# Patient Record
Sex: Female | Born: 1975 | Race: White | Hispanic: No | Marital: Single | Smoking: Never smoker
Health system: Southern US, Community
[De-identification: ages and names within clinical notes are randomized; demographics above are authoritative.]

## PROBLEM LIST (undated history)

## (undated) DIAGNOSIS — E2740 Unspecified adrenocortical insufficiency: Secondary | ICD-10-CM

## (undated) DIAGNOSIS — C719 Malignant neoplasm of brain, unspecified: Secondary | ICD-10-CM

## (undated) HISTORY — PX: BRAIN SURGERY: SHX531

---

## 2014-09-02 ENCOUNTER — Inpatient Hospital Stay (HOSPITAL_COMMUNITY)
Admission: EM | Admit: 2014-09-02 | Discharge: 2014-09-10 | DRG: 871 | Disposition: A | Discharge status: Home Health | Payer: Medicare Other | Attending: Internal Medicine | Admitting: Internal Medicine

## 2014-09-02 ENCOUNTER — Encounter (HOSPITAL_COMMUNITY): Payer: Self-pay | Admitting: *Deleted

## 2014-09-02 ENCOUNTER — Emergency Department (HOSPITAL_COMMUNITY): Payer: Medicare Other

## 2014-09-02 DIAGNOSIS — Z7952 Long term (current) use of systemic steroids: Secondary | ICD-10-CM | POA: Diagnosis not present

## 2014-09-02 DIAGNOSIS — E0590 Thyrotoxicosis, unspecified without thyrotoxic crisis or storm: Secondary | ICD-10-CM | POA: Diagnosis present

## 2014-09-02 DIAGNOSIS — A419 Sepsis, unspecified organism: Secondary | ICD-10-CM | POA: Diagnosis not present

## 2014-09-02 DIAGNOSIS — T380X5A Adverse effect of glucocorticoids and synthetic analogues, initial encounter: Secondary | ICD-10-CM | POA: Diagnosis present

## 2014-09-02 DIAGNOSIS — A084 Viral intestinal infection, unspecified: Secondary | ICD-10-CM | POA: Diagnosis not present

## 2014-09-02 DIAGNOSIS — R6520 Severe sepsis without septic shock: Secondary | ICD-10-CM | POA: Diagnosis present

## 2014-09-02 DIAGNOSIS — G9340 Encephalopathy, unspecified: Secondary | ICD-10-CM | POA: Diagnosis not present

## 2014-09-02 DIAGNOSIS — R6521 Severe sepsis with septic shock: Secondary | ICD-10-CM | POA: Diagnosis present

## 2014-09-02 DIAGNOSIS — E872 Acidosis, unspecified: Secondary | ICD-10-CM | POA: Diagnosis present

## 2014-09-02 DIAGNOSIS — E2740 Unspecified adrenocortical insufficiency: Secondary | ICD-10-CM | POA: Diagnosis present

## 2014-09-02 DIAGNOSIS — E870 Hyperosmolality and hypernatremia: Secondary | ICD-10-CM | POA: Diagnosis present

## 2014-09-02 DIAGNOSIS — E874 Mixed disorder of acid-base balance: Secondary | ICD-10-CM | POA: Diagnosis present

## 2014-09-02 DIAGNOSIS — Z881 Allergy status to other antibiotic agents status: Secondary | ICD-10-CM | POA: Diagnosis not present

## 2014-09-02 DIAGNOSIS — D649 Anemia, unspecified: Secondary | ICD-10-CM | POA: Diagnosis present

## 2014-09-02 DIAGNOSIS — Z79899 Other long term (current) drug therapy: Secondary | ICD-10-CM | POA: Diagnosis not present

## 2014-09-02 DIAGNOSIS — E878 Other disorders of electrolyte and fluid balance, not elsewhere classified: Secondary | ICD-10-CM | POA: Diagnosis present

## 2014-09-02 DIAGNOSIS — H9190 Unspecified hearing loss, unspecified ear: Secondary | ICD-10-CM | POA: Diagnosis present

## 2014-09-02 DIAGNOSIS — Z91048 Other nonmedicinal substance allergy status: Secondary | ICD-10-CM | POA: Diagnosis not present

## 2014-09-02 DIAGNOSIS — I5033 Acute on chronic diastolic (congestive) heart failure: Secondary | ICD-10-CM | POA: Diagnosis present

## 2014-09-02 DIAGNOSIS — N179 Acute kidney failure, unspecified: Secondary | ICD-10-CM | POA: Diagnosis not present

## 2014-09-02 DIAGNOSIS — Z85841 Personal history of malignant neoplasm of brain: Secondary | ICD-10-CM

## 2014-09-02 DIAGNOSIS — F419 Anxiety disorder, unspecified: Secondary | ICD-10-CM | POA: Diagnosis present

## 2014-09-02 DIAGNOSIS — I5032 Chronic diastolic (congestive) heart failure: Secondary | ICD-10-CM | POA: Diagnosis present

## 2014-09-02 DIAGNOSIS — R4182 Altered mental status, unspecified: Secondary | ICD-10-CM | POA: Diagnosis present

## 2014-09-02 DIAGNOSIS — Z923 Personal history of irradiation: Secondary | ICD-10-CM

## 2014-09-02 DIAGNOSIS — I517 Cardiomegaly: Secondary | ICD-10-CM | POA: Diagnosis present

## 2014-09-02 DIAGNOSIS — G9341 Metabolic encephalopathy: Secondary | ICD-10-CM | POA: Diagnosis present

## 2014-09-02 DIAGNOSIS — J9600 Acute respiratory failure, unspecified whether with hypoxia or hypercapnia: Secondary | ICD-10-CM | POA: Diagnosis not present

## 2014-09-02 DIAGNOSIS — J9601 Acute respiratory failure with hypoxia: Secondary | ICD-10-CM | POA: Diagnosis not present

## 2014-09-02 DIAGNOSIS — E230 Hypopituitarism: Secondary | ICD-10-CM | POA: Diagnosis present

## 2014-09-02 DIAGNOSIS — D899 Disorder involving the immune mechanism, unspecified: Secondary | ICD-10-CM | POA: Diagnosis present

## 2014-09-02 DIAGNOSIS — Z4659 Encounter for fitting and adjustment of other gastrointestinal appliance and device: Secondary | ICD-10-CM

## 2014-09-02 DIAGNOSIS — E893 Postprocedural hypopituitarism: Secondary | ICD-10-CM | POA: Diagnosis present

## 2014-09-02 DIAGNOSIS — E876 Hypokalemia: Secondary | ICD-10-CM | POA: Diagnosis not present

## 2014-09-02 DIAGNOSIS — R0600 Dyspnea, unspecified: Secondary | ICD-10-CM | POA: Diagnosis not present

## 2014-09-02 DIAGNOSIS — IMO0001 Reserved for inherently not codable concepts without codable children: Secondary | ICD-10-CM | POA: Diagnosis present

## 2014-09-02 DIAGNOSIS — R059 Cough, unspecified: Secondary | ICD-10-CM

## 2014-09-02 DIAGNOSIS — J9690 Respiratory failure, unspecified, unspecified whether with hypoxia or hypercapnia: Secondary | ICD-10-CM | POA: Diagnosis present

## 2014-09-02 DIAGNOSIS — R05 Cough: Secondary | ICD-10-CM

## 2014-09-02 DIAGNOSIS — K529 Noninfective gastroenteritis and colitis, unspecified: Secondary | ICD-10-CM | POA: Diagnosis not present

## 2014-09-02 DIAGNOSIS — Z7982 Long term (current) use of aspirin: Secondary | ICD-10-CM

## 2014-09-02 DIAGNOSIS — E232 Diabetes insipidus: Secondary | ICD-10-CM | POA: Diagnosis not present

## 2014-09-02 DIAGNOSIS — R0602 Shortness of breath: Secondary | ICD-10-CM

## 2014-09-02 DIAGNOSIS — R410 Disorientation, unspecified: Secondary | ICD-10-CM | POA: Diagnosis not present

## 2014-09-02 DIAGNOSIS — R40243 Glasgow coma scale score 3-8: Secondary | ICD-10-CM | POA: Diagnosis not present

## 2014-09-02 DIAGNOSIS — Z978 Presence of other specified devices: Secondary | ICD-10-CM

## 2014-09-02 DIAGNOSIS — R739 Hyperglycemia, unspecified: Secondary | ICD-10-CM | POA: Diagnosis present

## 2014-09-02 HISTORY — DX: Unspecified adrenocortical insufficiency: E27.40

## 2014-09-02 HISTORY — DX: Malignant neoplasm of brain, unspecified: C71.9

## 2014-09-02 LAB — COMPREHENSIVE METABOLIC PANEL
ALT: 17 U/L (ref 14–54)
AST: 28 U/L (ref 15–41)
Albumin: 3.1 g/dL — ABNORMAL LOW (ref 3.5–5.0)
Alkaline Phosphatase: 71 U/L (ref 38–126)
Anion gap: 15 (ref 5–15)
BUN: 16 mg/dL (ref 6–20)
CO2: 15 mmol/L — AB (ref 22–32)
Calcium: 8.1 mg/dL — ABNORMAL LOW (ref 8.9–10.3)
Chloride: 111 mmol/L (ref 101–111)
Creatinine, Ser: 1.54 mg/dL — ABNORMAL HIGH (ref 0.44–1.00)
GFR, EST AFRICAN AMERICAN: 48 mL/min — AB (ref >60)
GFR, EST NON AFRICAN AMERICAN: 41 mL/min — AB (ref >60)
GLUCOSE: 99 mg/dL (ref 65–99)
Potassium: 3.9 mmol/L (ref 3.5–5.1)
Sodium: 141 mmol/L (ref 135–145)
Total Bilirubin: 0.6 mg/dL (ref 0.3–1.2)
Total Protein: 6.7 g/dL (ref 6.5–8.1)

## 2014-09-02 LAB — CBC WITH DIFFERENTIAL/PLATELET
BASOS PCT: 0 % (ref 0–1)
Basophils Absolute: 0 10*3/uL (ref 0.0–0.1)
EOS PCT: 0 % (ref 0–5)
Eosinophils Absolute: 0 10*3/uL (ref 0.0–0.7)
HEMATOCRIT: 45 % (ref 36.0–46.0)
Hemoglobin: 14.4 g/dL (ref 12.0–15.0)
Lymphocytes Relative: 10 % — ABNORMAL LOW (ref 12–46)
Lymphs Abs: 1.5 10*3/uL (ref 0.7–4.0)
MCH: 22.7 pg — AB (ref 26.0–34.0)
MCHC: 32 g/dL (ref 30.0–36.0)
MCV: 71.1 fL — ABNORMAL LOW (ref 78.0–100.0)
MONOS PCT: 3 % (ref 3–12)
Monocytes Absolute: 0.5 10*3/uL (ref 0.1–1.0)
NEUTROS ABS: 13.2 10*3/uL — AB (ref 1.7–7.7)
Neutrophils Relative %: 87 % — ABNORMAL HIGH (ref 43–77)
Platelets: 721 10*3/uL — ABNORMAL HIGH (ref 150–400)
RBC: 6.33 MIL/uL — AB (ref 3.87–5.11)
RDW: 17.3 % — AB (ref 11.5–15.5)
WBC: 15.2 10*3/uL — AB (ref 4.0–10.5)

## 2014-09-02 LAB — URINALYSIS, ROUTINE W REFLEX MICROSCOPIC
Bilirubin Urine: NEGATIVE
Bilirubin Urine: NEGATIVE
GLUCOSE, UA: NEGATIVE mg/dL
Glucose, UA: NEGATIVE mg/dL
Ketones, ur: NEGATIVE mg/dL
Ketones, ur: NEGATIVE mg/dL
LEUKOCYTES UA: NEGATIVE
Leukocytes, UA: NEGATIVE
NITRITE: NEGATIVE
Nitrite: NEGATIVE
Protein, ur: NEGATIVE mg/dL
Protein, ur: NEGATIVE mg/dL
SPECIFIC GRAVITY, URINE: 1.011 (ref 1.005–1.030)
SPECIFIC GRAVITY, URINE: 1.003 — AB (ref 1.005–1.030)
UROBILINOGEN UA: 0.2 mg/dL (ref 0.0–1.0)
Urobilinogen, UA: 0.2 mg/dL (ref 0.0–1.0)
pH: 5 (ref 5.0–8.0)
pH: 5 (ref 5.0–8.0)

## 2014-09-02 LAB — URINE MICROSCOPIC-ADD ON

## 2014-09-02 LAB — AMYLASE: Amylase: 17 U/L — ABNORMAL LOW (ref 28–100)

## 2014-09-02 LAB — MRSA PCR SCREENING: MRSA by PCR: NEGATIVE

## 2014-09-02 LAB — PROCALCITONIN: Procalcitonin: 1.57 ng/mL

## 2014-09-02 LAB — I-STAT CG4 LACTIC ACID, ED
LACTIC ACID, VENOUS: 3.61 mmol/L — AB (ref 0.5–2.0)
Lactic Acid, Venous: 4.09 mmol/L (ref 0.5–2.0)

## 2014-09-02 LAB — LIPASE, BLOOD: Lipase: 12 U/L — ABNORMAL LOW (ref 22–51)

## 2014-09-02 LAB — GLUCOSE, CAPILLARY: GLUCOSE-CAPILLARY: 141 mg/dL — AB (ref 65–99)

## 2014-09-02 LAB — CORTISOL: Cortisol, Plasma: 46.3 ug/dL

## 2014-09-02 MED ORDER — SODIUM CHLORIDE 0.9 % IV BOLUS (SEPSIS)
2000.0000 mL | Freq: Once | INTRAVENOUS | Status: AC
  Administered 2014-09-02: 2000 mL via INTRAVENOUS

## 2014-09-02 MED ORDER — ENOXAPARIN SODIUM 40 MG/0.4ML ~~LOC~~ SOLN
40.0000 mg | SUBCUTANEOUS | Status: DC
  Administered 2014-09-02 – 2014-09-09 (×8): 40 mg via SUBCUTANEOUS
  Filled 2014-09-02 (×14): qty 0.4

## 2014-09-02 MED ORDER — DEXTROSE 5 % IV SOLN
INTRAVENOUS | Status: DC
  Administered 2014-09-02: 21:00:00 via INTRAVENOUS
  Filled 2014-09-02 (×3): qty 100

## 2014-09-02 MED ORDER — CIPROFLOXACIN IN D5W 400 MG/200ML IV SOLN
400.0000 mg | Freq: Two times a day (BID) | INTRAVENOUS | Status: DC
  Filled 2014-09-02: qty 200

## 2014-09-02 MED ORDER — CETYLPYRIDINIUM CHLORIDE 0.05 % MT LIQD
7.0000 mL | Freq: Two times a day (BID) | OROMUCOSAL | Status: DC
  Administered 2014-09-02: 7 mL via OROMUCOSAL

## 2014-09-02 MED ORDER — FAMOTIDINE IN NACL 20-0.9 MG/50ML-% IV SOLN
20.0000 mg | INTRAVENOUS | Status: DC
  Administered 2014-09-02 – 2014-09-03 (×2): 20 mg via INTRAVENOUS
  Filled 2014-09-02 (×3): qty 50

## 2014-09-02 MED ORDER — LIDOCAINE HCL (PF) 1 % IJ SOLN
INTRAMUSCULAR | Status: AC
  Administered 2014-09-02: 2 mL via INTRADERMAL
  Filled 2014-09-02: qty 5

## 2014-09-02 MED ORDER — SODIUM CHLORIDE 0.9 % IJ SOLN
10.0000 mL | Freq: Two times a day (BID) | INTRAMUSCULAR | Status: DC
  Administered 2014-09-02 – 2014-09-05 (×5): 10 mL
  Administered 2014-09-06 (×2): 20 mL
  Administered 2014-09-07: 30 mL
  Administered 2014-09-07 – 2014-09-08 (×2): 10 mL
  Administered 2014-09-08: 30 mL

## 2014-09-02 MED ORDER — ASPIRIN 81 MG PO CHEW
81.0000 mg | CHEWABLE_TABLET | Freq: Every day | ORAL | Status: DC
  Administered 2014-09-03: 81 mg via ORAL
  Filled 2014-09-02: qty 1

## 2014-09-02 MED ORDER — VANCOMYCIN HCL IN DEXTROSE 1-5 GM/200ML-% IV SOLN
1000.0000 mg | Freq: Once | INTRAVENOUS | Status: AC
  Administered 2014-09-02: 1000 mg via INTRAVENOUS
  Filled 2014-09-02: qty 200

## 2014-09-02 MED ORDER — SODIUM CHLORIDE 0.9 % IJ SOLN
10.0000 mL | INTRAMUSCULAR | Status: DC | PRN
  Administered 2014-09-03 – 2014-09-09 (×2): 10 mL
  Filled 2014-09-02 (×2): qty 40

## 2014-09-02 MED ORDER — ACETAMINOPHEN 500 MG PO TABS
1000.0000 mg | ORAL_TABLET | Freq: Once | ORAL | Status: AC
  Administered 2014-09-02: 1000 mg via ORAL
  Filled 2014-09-02: qty 2

## 2014-09-02 MED ORDER — VANCOMYCIN HCL IN DEXTROSE 750-5 MG/150ML-% IV SOLN
750.0000 mg | Freq: Two times a day (BID) | INTRAVENOUS | Status: DC
  Administered 2014-09-03 – 2014-09-04 (×3): 750 mg via INTRAVENOUS
  Filled 2014-09-02 (×4): qty 150

## 2014-09-02 MED ORDER — ACETAMINOPHEN 650 MG RE SUPP: 650.0000 mg | Freq: Once | RECTAL | Status: DC

## 2014-09-02 MED ORDER — PIPERACILLIN-TAZOBACTAM 3.375 G IVPB 30 MIN
3.3750 g | Freq: Once | INTRAVENOUS | Status: AC
  Administered 2014-09-02: 3.375 g via INTRAVENOUS
  Filled 2014-09-02: qty 50

## 2014-09-02 MED ORDER — METRONIDAZOLE IN NACL 5-0.79 MG/ML-% IV SOLN
500.0000 mg | Freq: Three times a day (TID) | INTRAVENOUS | Status: DC
  Administered 2014-09-03 – 2014-09-04 (×3): 500 mg via INTRAVENOUS
  Filled 2014-09-02 (×4): qty 100

## 2014-09-02 MED ORDER — METRONIDAZOLE IN NACL 5-0.79 MG/ML-% IV SOLN
500.0000 mg | Freq: Three times a day (TID) | INTRAVENOUS | Status: DC
  Filled 2014-09-02: qty 100

## 2014-09-02 MED ORDER — SODIUM CHLORIDE 0.9 % IV SOLN: 250.0000 mL | INTRAVENOUS | Status: DC | PRN

## 2014-09-02 MED ORDER — HYDROCORTISONE NA SUCCINATE PF 100 MG IJ SOLR: 50.0000 mg | Freq: Four times a day (QID) | INTRAMUSCULAR | Status: DC

## 2014-09-02 MED ORDER — PIPERACILLIN-TAZOBACTAM 3.375 G IVPB: 3.3750 g | Freq: Three times a day (TID) | INTRAVENOUS | Status: DC

## 2014-09-02 MED ORDER — ACETAMINOPHEN 325 MG PO TABS
650.0000 mg | ORAL_TABLET | ORAL | Status: DC | PRN
  Administered 2014-09-08: 650 mg via ORAL
  Filled 2014-09-02: qty 2

## 2014-09-02 MED ORDER — ONDANSETRON HCL 4 MG/2ML IJ SOLN: 4.0000 mg | Freq: Four times a day (QID) | INTRAMUSCULAR | Status: DC | PRN

## 2014-09-02 MED ORDER — SODIUM CHLORIDE 0.9 % IV SOLN
INTRAVENOUS | Status: AC
  Administered 2014-09-02: 21:00:00 via INTRAVENOUS

## 2014-09-02 MED ORDER — INSULIN ASPART 100 UNIT/ML ~~LOC~~ SOLN
0.0000 [IU] | SUBCUTANEOUS | Status: DC
  Administered 2014-09-02 – 2014-09-03 (×5): 5 [IU] via SUBCUTANEOUS
  Administered 2014-09-03 – 2014-09-04 (×3): 3 [IU] via SUBCUTANEOUS
  Administered 2014-09-04: 5 [IU] via SUBCUTANEOUS

## 2014-09-02 MED ORDER — LEVOTHYROXINE SODIUM 100 MCG IV SOLR
50.0000 ug | Freq: Every day | INTRAVENOUS | Status: DC
Start: 2014-09-02 — End: 2014-09-03
  Administered 2014-09-02: 50 ug via INTRAVENOUS
  Filled 2014-09-02 (×2): qty 5

## 2014-09-02 MED ORDER — CIPROFLOXACIN IN D5W 400 MG/200ML IV SOLN
400.0000 mg | Freq: Two times a day (BID) | INTRAVENOUS | Status: DC
  Administered 2014-09-02 – 2014-09-04 (×4): 400 mg via INTRAVENOUS
  Filled 2014-09-02 (×5): qty 200

## 2014-09-02 MED ORDER — LIDOCAINE HCL (PF) 1 % IJ SOLN
2.0000 mL | Freq: Once | INTRAMUSCULAR | Status: AC
  Administered 2014-09-02: 2 mL via INTRADERMAL

## 2014-09-02 MED ORDER — HYDROCORTISONE NA SUCCINATE PF 100 MG IJ SOLR
50.0000 mg | Freq: Four times a day (QID) | INTRAMUSCULAR | Status: DC
  Administered 2014-09-02 – 2014-09-03 (×3): 50 mg via INTRAVENOUS
  Filled 2014-09-02: qty 2
  Filled 2014-09-02: qty 1
  Filled 2014-09-02: qty 2
  Filled 2014-09-02 (×2): qty 1
  Filled 2014-09-02: qty 2
  Filled 2014-09-02: qty 1

## 2014-09-02 MED ORDER — PANTOPRAZOLE SODIUM 40 MG IV SOLR: 40.0000 mg | INTRAVENOUS | Status: DC

## 2014-09-02 MED ORDER — SODIUM CHLORIDE 0.9 % IV BOLUS (SEPSIS): 500.0000 mL | INTRAVENOUS | Status: DC

## 2014-09-02 MED ORDER — SODIUM CHLORIDE 0.9 % IV BOLUS (SEPSIS)
1000.0000 mL | INTRAVENOUS | Status: AC
  Administered 2014-09-02 (×2): 1000 mL via INTRAVENOUS

## 2014-09-02 NOTE — ED Notes (Signed)
IV team at bedside 

## 2014-09-02 NOTE — ED Notes (Signed)
Pt undressed, in gown, on monitor, continuous pulse oximetry and blood pressure cuff 

## 2014-09-02 NOTE — ED Notes (Signed)
Pt coughing up liquid, pt states, "My water went down the wrong pipe when I took my pill." upon inspection there were no pills in the emesis bag, Winfred Leeds, MD notified

## 2014-09-02 NOTE — ED Notes (Signed)
Danielle, RN assisted me with in and out cath

## 2014-09-02 NOTE — ED Notes (Signed)
Hayley, RN attempting IV access at this time

## 2014-09-02 NOTE — ED Notes (Signed)
Pt in via [facility], per report pt has HR 150, pt palpated BP 90, pt appears diaphoretic, pt reports n/v/d onset x 24 hrs, pt has SOB, pt hx of Brain CA & adrenal insufficiency, pt reports not taking her hydrocortisone 24 hrs ago, pt A&O x4

## 2014-09-02 NOTE — ED Notes (Signed)
Jacubowitz, MD at bedside unable to gain IV access, this RN attempted US guided IV & was unable to gain IV access, IV team consult placed

## 2014-09-02 NOTE — Progress Notes (Signed)
ANTIBIOTIC CONSULT NOTE - INITIAL  Pharmacy Consult:  Vancomycin / Zosyn Indication:  Sepsis  Allergies  Allergen Reactions  . Iodine Swelling    pts fingers swell  . Sulfa Antibiotics Swelling    pts fingers swell    Patient Measurements: Height: 5' (152.4 cm) Weight: 161 lb (73.029 kg) IBW/kg (Calculated) : 45.5  Vital Signs: Temp: 104 F (40 C) (07/06 1100) Temp Source: Rectal (07/06 1100) BP: 104/59 mmHg (07/06 1041) Pulse Rate: 154 (07/06 1041)  Labs:  Recent Labs  09/02/14 1100  WBC 15.2*  HGB 14.4  PLT 721*   CrCl cannot be calculated (Patient has no serum creatinine result on file.). No results for input(s): VANCOTROUGH, VANCOPEAK, VANCORANDOM, GENTTROUGH, GENTPEAK, GENTRANDOM, TOBRATROUGH, TOBRAPEAK, TOBRARND, AMIKACINPEAK, AMIKACINTROU, AMIKACIN in the last 72 hours.   Microbiology: No results found for this or any previous visit (from the past 720 hour(s)).  Medical History: No past medical history on file.    Assessment: 44 YOF presented with nausea, vomiting, and diarrhea.  Pharmacy consulted to initiate vancomycin and Zosyn for sepsis.  First doses of antibiotics are already ordered and given around 1230.  Baseline labs reviewed.  Vanc 7/6 >> Zosyn 7/6 >>  7/6 UCx -  7/6 BCx x2 -    Goal of Therapy:  Vancomycin trough level 15-20 mcg/ml   Plan:  - Vanc 750mg  IV Q12H - Zosyn 3.375gm IV Q8H, 4 hr infusion - Monitor renal fxn, clinical progress, vanc trough as indicated    Taegen Lennox D. Mina Marble, PharmD, BCPS Pager:  [phone] 09/02/2014, 1:24 PM

## 2014-09-02 NOTE — ED Notes (Signed)
Admitting MD at bedside.

## 2014-09-02 NOTE — ED Notes (Signed)
Pt had massive diarrhea covering her backside from mid calf to upper back; washed pt with soap and water, rinsed off and then dried with towel; replaced stretcher linens and pt's gown; placed several chuks underneath pt along with clean dry diaper on pt; replaced top linens as well; pt is resting at this time

## 2014-09-02 NOTE — ED Notes (Signed)
Dr. Merrilyn Puma [phone] Endocrinology MD in [place] who is PCP

## 2014-09-02 NOTE — ED Provider Notes (Addendum)
CSN: [id number]     Arrival date & time 09/02/14  1026 History   First MD Initiated Contact with Patient 09/02/14 1031     No chief complaint on file.    (Consider location/radiation/quality/duration/timing/severity/associated sxs/prior Treatment) HPI patient with vomiting and diarrhea for the past 2 days. Denies shortness of breath associated symptoms include generalized weakness. She denies abdominal pain denies pain anywhere. She was treated by EMS with Solu-Medrol 125 Mill grams IM prior to coming here nothing makes symptoms better or worse. No other associated symptoms he last menstrual period one month ago, normal No recent travel no recent antibiotics. Other associated symptoms mother reports slight cough since yesterday No past medical history on file. past medical history brain tumor several years ago, No past surgical history on file. surgical history craniotomy No family history on file. History  Substance Use Topics  . Smoking status: Not on file  . Smokeless tobacco: Not on file  . Alcohol Use: Not on file   no tobacco no alcohol no drugs OB History    No data available     Review of Systems  Gastrointestinal: Positive for vomiting and diarrhea.  Allergic/Immunologic: Positive for immunocompromised state.       Chronically on steroids  Neurological: Positive for weakness.  All other systems reviewed and are negative.     Allergies  Review of patient's allergies indicates not on file.  Home Medications   Prior to Admission medications   Not on File   There were no vitals taken for this visit. Physical Exam  Constitutional: She appears well-developed and well-nourished.  Alert Glasgow Coma Score 15 ,moderately ill-appearing  HENT:  Head: Normocephalic and atraumatic.  Mucous membranes dry  Eyes: Conjunctivae are normal. Pupils are equal, round, and reactive to light.  Neck: Neck supple. No tracheal deviation present. No thyromegaly present.  Cardiovascular:  Regular rhythm.   No murmur heard. Tachycardic  Pulmonary/Chest: Effort normal and breath sounds normal.  Abdominal: Soft. Bowel sounds are normal. She exhibits no distension. There is no tenderness.  Musculoskeletal: Normal range of motion. She exhibits no edema or tenderness.  Neurological: She is alert. Coordination normal.  Skin: Skin is warm and dry. No rash noted.  Psychiatric: She has a normal mood and affect.  Nursing note and vitals reviewed.   ED Course  Procedures (including critical care time) Labs Review Labs Reviewed - No data to display  Imaging Review No results found.   EKG Interpretation   Date/Time:  Wednesday September 02 2014 10:38:32 EDT Ventricular Rate:  155 PR Interval:  108 QRS Duration: 70 QT Interval:  278 QTC Calculation: 446 R Axis:   41 Text Interpretation:  Sinus tachycardia Minimal ST depression No old  tracing to compare Confirmed by Skyeler Scalese  MD, Kaveon Blatz [phone]) on 09/02/2014  11:22:29 AM     1:15 PM patient feels somewhat improved after treatment with intravenous antibiotics and intravenous fluids  2:20 PM patient continues to feel improved after further treatment with intravenous fluids. Chest x-ray viewed by me Results for orders placed or performed during the hospital encounter of 09/02/14  Blood Culture (routine x 2)  Result Value Ref Range   Specimen Description BLOOD LEFT HAND    Special Requests BOTTLES DRAWN AEROBIC ONLY 5ML    Culture PENDING    Report Status PENDING   Blood Culture (routine x 2)  Result Value Ref Range   Specimen Description BLOOD RIGHT HAND    Special Requests BOTTLES DRAWN AEROBIC ONLY 5ML  Culture PENDING    Report Status PENDING   CBC with Differential/Platelet  Result Value Ref Range   WBC 15.2 (H) 4.0 - 10.5 K/uL   RBC 6.33 (H) 3.87 - 5.11 MIL/uL   Hemoglobin 14.4 12.0 - 15.0 g/dL   HCT 45.0 36.0 - 46.0 %   MCV 71.1 (L) 78.0 - 100.0 fL   MCH 22.7 (L) 26.0 - 34.0 pg   MCHC 32.0 30.0 - 36.0 g/dL    RDW 17.3 (H) 11.5 - 15.5 %   Platelets 721 (H) 150 - 400 K/uL   Neutrophils Relative % 87 (H) 43 - 77 %   Neutro Abs 13.2 (H) 1.7 - 7.7 K/uL   Lymphocytes Relative 10 (L) 12 - 46 %   Lymphs Abs 1.5 0.7 - 4.0 K/uL   Monocytes Relative 3 3 - 12 %   Monocytes Absolute 0.5 0.1 - 1.0 K/uL   Eosinophils Relative 0 0 - 5 %   Eosinophils Absolute 0.0 0.0 - 0.7 K/uL   Basophils Relative 0 0 - 1 %   Basophils Absolute 0.0 0.0 - 0.1 K/uL  Urinalysis, Routine w reflex microscopic (not at [facility])  Result Value Ref Range   Color, Urine YELLOW YELLOW   APPearance CLEAR CLEAR   Specific Gravity, Urine 1.011 1.005 - 1.030   pH 5.0 5.0 - 8.0   Glucose, UA NEGATIVE NEGATIVE mg/dL   Hgb urine dipstick MODERATE (A) NEGATIVE   Bilirubin Urine NEGATIVE NEGATIVE   Ketones, ur NEGATIVE NEGATIVE mg/dL   Protein, ur NEGATIVE NEGATIVE mg/dL   Urobilinogen, UA 0.2 0.0 - 1.0 mg/dL   Nitrite NEGATIVE NEGATIVE   Leukocytes, UA NEGATIVE NEGATIVE  Cortisol  Result Value Ref Range   Cortisol, Plasma 46.3 ug/dL  Comprehensive metabolic panel  Result Value Ref Range   Sodium 141 135 - 145 mmol/L   Potassium 3.9 3.5 - 5.1 mmol/L   Chloride 111 101 - 111 mmol/L   CO2 15 (L) 22 - 32 mmol/L   Glucose, Bld 99 65 - 99 mg/dL   BUN 16 6 - 20 mg/dL   Creatinine, Ser 1.54 (H) 0.44 - 1.00 mg/dL   Calcium 8.1 (L) 8.9 - 10.3 mg/dL   Total Protein 6.7 6.5 - 8.1 g/dL   Albumin 3.1 (L) 3.5 - 5.0 g/dL   AST 28 15 - 41 U/L   ALT 17 14 - 54 U/L   Alkaline Phosphatase 71 38 - 126 U/L   Total Bilirubin 0.6 0.3 - 1.2 mg/dL   GFR calc non Af Amer 41 (L) >60 mL/min   GFR calc Af Amer 48 (L) >60 mL/min   Anion gap 15 5 - 15  Urine microscopic-add on  Result Value Ref Range   Squamous Epithelial / LPF FEW (A) RARE   WBC, UA 0-2 <3 WBC/hpf   RBC / HPF 3-6 <3 RBC/hpf   Bacteria, UA FEW (A) RARE   Casts GRANULAR CAST (A) NEGATIVE  I-Stat CG4 Lactic Acid, ED  Result Value Ref Range   Lactic Acid, Venous 3.61 (HH) 0.5 - 2.0  mmol/L   Comment NOTIFIED PHYSICIAN    Dg Chest Port 1 View  09/02/2014   CLINICAL DATA:  39 year old female with shortness of breath and cough. Initial encounter.  EXAM: PORTABLE CHEST - 1 VIEW  COMPARISON:  None.  FINDINGS: Portable AP semi upright view at 1311 hrs. Low lung volumes. Normal cardiac size and mediastinal contours. Visualized tracheal air column is within normal limits. No pneumothorax, pulmonary  edema, pleural effusion or confluent pulmonary opacity. Levoconvex thoracolumbar scoliosis. No acute osseous abnormality identified.  IMPRESSION: Low lung volumes, otherwise no acute cardiopulmonary abnormality.   Electronically Signed   By: Genevie Ann M.D.   On: 09/02/2014 13:20    MDM  Code sepsis called based on fever, pulse rate. Patient is immunocompromised broad-spectrum antibiotics ordered Final diagnoses:  None   spoke with internal medicine clinic resident physician who will make arrangements for inpatient stay  Dx #1 sepsis #2 acute kidney injury #3nausea, vomiting and diarrhea CRITICAL CARE Performed by: Orlie Dakin Total critical care time: 30 minute Critical care time was exclusive of separately billable procedures and treating other patients. Critical care was necessary to treat or prevent imminent or life-threatening deterioration. Critical care was time spent personally by me on the following activities: development of treatment plan with patient and/or surrogate as well as nursing, discussions with consultants, evaluation of patient's response to treatment, examination of patient, obtaining history from patient or surrogate, ordering and performing treatments and interventions, ordering and review of laboratory studies, ordering and review of radiographic studies, pulse oximetry and re-evaluation of patient's condition.    Orlie Dakin, MD 09/02/14 1424  Addendum in light of rise in serum lactate from 3.61 to 4.09 I contacted critical care physician Dr.Mongal. He  suggested that patient can be admitted to stepdown unit. Critical care team is available for consultation at internal medicine service's discretion.  Orlie Dakin, MD 09/02/14 1515

## 2014-09-02 NOTE — ED Notes (Signed)
Critical Care MD unable to obtain central line access, IV team at bedside to attempt IV access, pt has x 2 IVs at this time, pt to wait for PICC line access, pt & family aware of plan of care

## 2014-09-02 NOTE — H&P (Signed)
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 Admited via ED 7/06 to ICU/PCCM service with one day history of N/V/D, lactic acidosis, fever.  Concern for occult shock.  MAJOR EVENTS/TEST RESULTS:   INDWELLING DEVICES:: PICC (ordered) 7/06 >>   MICRO DATA: Urine 7/06 >>  C diff 7/06 >>  Enteric pathogens 7/06 >>  Blood 7/06 >>   ANTIMICROBIALS:  Vanc 7/06 >>  Metronidazole 7/06 >>  Ciproflixacin 7/06 >>    HISTORY OF PRESENT ILLNESS:  Beth Fitzgerald is a 39 y.o. 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  She was brought to the [facility] ED 09/02/14 for N/V/D x 2 days.  She was apparently in her USOH prior to symptom onset.  Symptoms began suddenly with nausea followed by vomiting.  She then began to have diarrhea which has persisted since.  Her mother was sick 2 - 3 days prior with GI / flu symptoms (including nausea / vomiting).  No additional exposures to sick contacts or possible exposures to bad foods. Diarrhea got to the point that pt felt very dehydrated and was unable to take her maintenance steroids and DDAVP causing her to have greater urinary output than normal as well.  Since there was no improvement after almost 2 days, mother decided to bring her to ED for further evaluation.  In ED, she was found to be febrile to 102 rectally.  She also had an initial lactate of 3.61 which later increased to 4.09 despite IVF's.  Blood pressure remained normal; however, since arrival, pt was tachypneic with RR into the 40's and intermittent hypoxia with SpO2 in high 80's.  Due to rising lactate and no improvement in tachypnea almost 7 hours later, PCCM was called  to evaluate pt for possible ICU admission.  She has not had any recent antibiotics.  She denies any recent fevers/chills/sweats, headaches, chest pain, SOB, cough, myalgias.  Has not missed any recent steroids except for day of presentation and 1 day prior since she had vomiting.  During our exam, she denies abd pain; however, on exam she has mild tenderness in RUQ.  She remains tachypneic with RR varying from 30's to 50's.  Mild increase in WOB but no accessory muscle use or paradoxical respirations.  PAST MEDICAL HISTORY :   has a past medical history of Brain cancer and Adrenal cortex insufficiency.  has past surgical history that includes Brain surgery. Prior to Admission medications   Medication Sig Start Date End Date Taking? Authorizing Provider  aspirin EC 81 MG tablet Take 81 mg by mouth daily.   Yes Historical Provider, MD  Calcium Carb-Cholecalciferol (CALCIUM 600+D) 600-800 MG-UNIT TABS Take 1 tablet by mouth daily.   Yes Historical Provider, MD  desmopressin (DDAVP NASAL) 0.01 % solution Place 1 spray into both nostrils daily.  07/31/14  Yes Historical Provider, MD  hydrocortisone (CORTEF) 5 MG tablet Take 10 mg by mouth daily.   Yes Historical Provider, MD  levothyroxine (SYNTHROID, LEVOTHROID) 100 MCG tablet Take 100 mcg by mouth daily before breakfast.   Yes Historical Provider, MD  NEXIUM 40 MG capsule Take 40 mg by mouth daily. 06/10/14  Yes Historical Provider, MD  TRINESSA, 28, 0.18/0.215/0.25 MG-35  MCG tablet Take 1 tablet by mouth daily. 07/09/14  Yes Historical Provider, MD   Allergies  Allergen Reactions  . Iodine Swelling    pts fingers swell  . Sulfa Antibiotics Swelling    pts fingers swell    FAMILY HISTORY:  has no family status information on file.  SOCIAL HISTORY:  reports that she has never smoked. She does not have any smokeless tobacco history on file. She reports that she does not drink alcohol or use illicit drugs.  REVIEW OF SYSTEMS:   All negative;  except for those that are bolded, which indicate positives.  Constitutional: weight loss, weight gain, night sweats, fevers, chills, fatigue, weakness.  HEENT: headaches, sore throat, sneezing, nasal congestion, post nasal drip, difficulty swallowing, tooth/dental problems, visual complaints, visual changes, ear aches. Neuro: difficulty with speech, weakness, numbness, ataxia. CV:  chest pain, orthopnea, PND, swelling in lower extremities, dizziness, palpitations, syncope.  Resp: cough, hemoptysis, dyspnea, wheezing. GI  heartburn, indigestion, abdominal pain, nausea, vomiting, diarrhea, constipation, change in bowel habits, loss of appetite, hematemesis, melena, hematochezia.  GU: dysuria, change in color of urine, urgency or frequency, flank pain, hematuria. MSK: joint pain or swelling, decreased range of motion. Psych: change in mood or affect, depression, anxiety, suicidal ideations, homicidal ideations. Skin: rash, itching, bruising.   SUBJECTIVE:  Has had 3 episodes of diarrhea since ED arrival.  Remains tachypneic with RR varying from 30's to 50's.  Denies chest pain, SOB.  VITAL SIGNS: Temp:  [102.9 F (39.4 C)-104 F (40 C)] 102.9 F (39.4 C) (07/06 1242) Pulse Rate:  [124-154] 129 (07/06 1545) Resp:  [16-57] 30 (07/06 1430) BP: (103-126)/(59-85) 125/85 mmHg (07/06 1545) SpO2:  [91 %-95 %] 95 % (07/06 1545) Weight:  [73.029 kg (161 lb)] 73.029 kg (161 lb) (07/06 1109) HEMODYNAMICS:   VENTILATOR SETTINGS:   INTAKE / OUTPUT:  Intake/Output Summary (Last 24 hours) at 09/02/14 1628 Last data filed at 09/02/14 1540  Gross per 24 hour  Intake   3250 ml  Output      0 ml  Net   3250 ml    PHYSICAL EXAMINATION: General: Cognition intact, anxious, very tachhypneic Neuro: RASS +1, + F/C, no focal deficits HEENT: NCAT, B hearing aids Cardiovascular: Tachy, reg, no M noted Lungs: Tachypneic, bilateral basilar crackles Abdomen: mildly obese, soft, normal to hyperactive BS,  mild RUQ tenderness, no rebound, no guarding Ext: cool, no edema, 1+ symmetric DP pulses Skin: No lesions  LABS:  CBC  Recent Labs Lab 09/02/14 1100  WBC 15.2*  HGB 14.4  HCT 45.0  PLT 721*   Coag's No results for input(s): APTT, INR in the last 168 hours. BMET  Recent Labs Lab 09/02/14 1100  NA 141  K 3.9  CL 111  CO2 15*  BUN 16  CREATININE 1.54*  GLUCOSE 99   Electrolytes  Recent Labs Lab 09/02/14 1100  CALCIUM 8.1*   Sepsis Markers  Recent Labs Lab 09/02/14 1138 09/02/14 1457  LATICACIDVEN 3.61* 4.09*   ABG No results for input(s): PHART, PCO2ART, PO2ART in the last 168 hours. Liver Enzymes  Recent Labs Lab 09/02/14 1100  AST 28  ALT 17  ALKPHOS 71  BILITOT 0.6  ALBUMIN 3.1*   Cardiac Enzymes No results for input(s): TROPONINI, PROBNP in the last 168 hours. Glucose No results for input(s): GLUCAP in the last 168 hours.  Imaging Dg Chest Port 1 View  09/02/2014   CLINICAL DATA:  39 year old female with shortness of breath and cough. Initial  encounter.  EXAM: PORTABLE CHEST - 1 VIEW  COMPARISON:  None.  FINDINGS: Portable AP semi upright view at 1311 hrs. Low lung volumes. Normal cardiac size and mediastinal contours. Visualized tracheal air column is within normal limits. No pneumothorax, pulmonary edema, pleural effusion or confluent pulmonary opacity. Levoconvex thoracolumbar scoliosis. No acute osseous abnormality identified.  IMPRESSION: Low lung volumes, otherwise no acute cardiopulmonary abnormality.   Electronically Signed   By: Genevie Ann M.D.   On: 09/02/2014 13:20     ASSESSMENT / PLAN:  PULMONARY A: Acute hypoxic respiratory failure. Respiratory distress - anxiety, metabolic acidosis. Suspect early ALI vs edema. High risk of intubation. P:   Continue supplemental O2 as needed to maintain SpO2 > 92%. Bicarb gtt to help offset tachypnea. If continues to remain tachypneic and anxious, could attempt low dose ativan. Pulmonary  hygiene. May require intubation if deteriorates. CXR in AM.  CARDIOVASCULAR PICC (pending) 7/6 >>> A:  Lactic acidosis, occult septic shock. Sinus tachycardia - reactive. P:  Repeat lactate. Check troponin. Stress steroids. Consider CVP's. Monitor hemodynamics.  RENAL A:   AKI. AG + NAG metabolic acidosis - lactate + diarrhea / vomiting. P:   HCO3 @ 143ml/hr. Correct electrolytes as indicated. BMP in AM.  GASTROINTESTINAL A:   N/V/D with mild abd pain - suspected gastroenteritis. GI propylaxis. P:   Lipase, Amylase. Famotidine.  HEMATOLOGIC A:   No acute issues. VTE prophylaxis. P:  SCD's / Lovenox. CBC in AM.  INFECTIOUS A:   Severe sepsis. Suspected gastroenteritis. R/O C diff. P:   Micro and abx as above  ENDOCRINE A:  Panhypopituitarism. Steroid induced hyperglycemia. P:   Levothyroxine IV (home dose 100 mcg PO daily). TSH in AM. Stress dose steroids. SSI - mod scale.  NEUROLOGIC A:   Anxiety. Hearing loss. P:   Consider low dose ativan. Monitor.   FAMILY  - Updates: Mother at bedside.  - Inter-disciplinary family meet or Palliative Care meeting due by:  09/08/14.    Montey Hora, [facility] Pager: [phone]  or [phone] 09/02/2014, 5:23 PM   PCCM ATTENDING: I have reviewed pt's initial presentation, consultants notes and hospital database in detail.  The above assessment and plan was formulated under my direction.  In summary: I evaluated pt in ED and much of note above was constructed by me. This is occult shock, lactic acidosis, likely due to severe sepsis. Her primary complaints were abdominal - N/V/D - so this is presumably an abdominal source (? Gastroenteritis). Central venous access will be needed but we were unable to place IJ or [place] due to severe tachypnea. A PICC has been ordered   45 minutes of independent CCM time was provided by me   Merton Border, MD;  PCCM  service; Mobile [phone]

## 2014-09-02 NOTE — ED Notes (Signed)
Jacubowitz, MD present in room when vitals were taken

## 2014-09-02 NOTE — ED Notes (Signed)
Pt had small amount of diarrhea; cleaned pt and placed on a diaper; visitor at bedside

## 2014-09-02 NOTE — Progress Notes (Signed)
Peripherally Inserted Central Catheter/Midline Placement  The IV Nurse has discussed with the patient and/or persons authorized to consent for the patient, the purpose of this procedure and the potential benefits and risks involved with this procedure.  The benefits include less needle sticks, lab draws from the catheter and patient may be discharged home with the catheter.  Risks include, but not limited to, infection, bleeding, blood clot (thrombus formation), and puncture of an artery; nerve damage and irregular heat beat.  Alternatives to this procedure were also discussed.  PICC/Midline Placement Documentation  PICC Triple Lumen 49/70/26 PICC Right Basilic 40 cm 0 cm (Active)  Indication for Insertion or Continuance of Line Limited venous access - need for IV therapy >5 days (PICC only) 09/02/2014  8:45 PM  Exposed Catheter (cm) 0 cm 09/02/2014  8:45 PM  Site Assessment Clean;Dry;Intact 09/02/2014  8:45 PM  Lumen #1 Status Flushed;Blood return noted;Saline locked 09/02/2014  8:45 PM  Lumen #2 Status Flushed;Saline locked;Blood return noted 09/02/2014  8:45 PM  Lumen #3 Status Flushed;Saline locked;Blood return noted 09/02/2014  8:45 PM  Dressing Type Transparent 09/02/2014  8:45 PM  Dressing Status Clean;Dry;Intact;Antimicrobial disc in place 09/02/2014  8:45 PM  Dressing Intervention New dressing 09/02/2014  8:45 PM  Dressing Change Due 09/09/14 09/02/2014  8:45 PM       Aldona Lento L 09/02/2014, 9:01 PM

## 2014-09-02 NOTE — Progress Notes (Signed)
eLink Physician-Brief Progress Note Patient Name: Beth Fitzgerald DOB: [birth year] MRN: [id number]   Date of Service  09/02/2014  HPI/Events of Note  Delirium - trying to get out of bed. Fall risk - very unsteady on feet.   eICU Interventions  Will order: 1. Vest restraint 2. Bedside sitter.     Intervention Category Major Interventions: Delirium, psychosis, severe agitation - evaluation and management  Sommer,Steven Eugene 09/02/2014, 11:52 PM

## 2014-09-02 NOTE — ED Notes (Signed)
Attempted report 

## 2014-09-02 NOTE — ED Notes (Signed)
Winfred Leeds, MD notified re: pts RR

## 2014-09-03 ENCOUNTER — Inpatient Hospital Stay (HOSPITAL_COMMUNITY): Payer: Medicare Other

## 2014-09-03 DIAGNOSIS — R4182 Altered mental status, unspecified: Secondary | ICD-10-CM | POA: Diagnosis present

## 2014-09-03 DIAGNOSIS — E232 Diabetes insipidus: Secondary | ICD-10-CM

## 2014-09-03 DIAGNOSIS — J9690 Respiratory failure, unspecified, unspecified whether with hypoxia or hypercapnia: Secondary | ICD-10-CM | POA: Diagnosis present

## 2014-09-03 DIAGNOSIS — J9600 Acute respiratory failure, unspecified whether with hypoxia or hypercapnia: Secondary | ICD-10-CM

## 2014-09-03 DIAGNOSIS — R40243 Glasgow coma scale score 3-8: Secondary | ICD-10-CM

## 2014-09-03 LAB — GLUCOSE, CAPILLARY
GLUCOSE-CAPILLARY: 214 mg/dL — AB (ref 65–99)
GLUCOSE-CAPILLARY: 219 mg/dL — AB (ref 65–99)
GLUCOSE-CAPILLARY: 238 mg/dL — AB (ref 65–99)
Glucose-Capillary: 247 mg/dL — ABNORMAL HIGH (ref 65–99)
Glucose-Capillary: 260 mg/dL — ABNORMAL HIGH (ref 65–99)
Glucose-Capillary: 156 mg/dL — ABNORMAL HIGH (ref 65–99)
Glucose-Capillary: 158 mg/dL — ABNORMAL HIGH (ref 65–99)

## 2014-09-03 LAB — COMPREHENSIVE METABOLIC PANEL
ALT: 19 U/L (ref 14–54)
AST: 29 U/L (ref 15–41)
Albumin: 2.3 g/dL — ABNORMAL LOW (ref 3.5–5.0)
Alkaline Phosphatase: 53 U/L (ref 38–126)
BILIRUBIN TOTAL: 0.5 mg/dL (ref 0.3–1.2)
BUN: 9 mg/dL (ref 6–20)
CALCIUM: 7.8 mg/dL — AB (ref 8.9–10.3)
CO2: 27 mmol/L (ref 22–32)
Creatinine, Ser: 1.58 mg/dL — ABNORMAL HIGH (ref 0.44–1.00)
GFR calc non Af Amer: 40 mL/min — ABNORMAL LOW (ref >60)
GFR, EST AFRICAN AMERICAN: 46 mL/min — AB (ref >60)
GLUCOSE: 153 mg/dL — AB (ref 65–99)
Potassium: 2.9 mmol/L — ABNORMAL LOW (ref 3.5–5.1)
Sodium: >180 mmol/L (ref 135–145)
Total Protein: 5.4 g/dL — ABNORMAL LOW (ref 6.5–8.1)

## 2014-09-03 LAB — POCT I-STAT 3, ART BLOOD GAS (G3+)
Acid-base deficit: 14 mmol/L — ABNORMAL HIGH (ref 0.0–2.0)
Acid-base deficit: 6 mmol/L — ABNORMAL HIGH (ref 0.0–2.0)
Acid-base deficit: 1 mmol/L (ref 0.0–2.0)
Bicarbonate: 9.7 mEq/L — ABNORMAL LOW (ref 20.0–24.0)
Bicarbonate: 18.3 mEq/L — ABNORMAL LOW (ref 20.0–24.0)
Bicarbonate: 27 mEq/L — ABNORMAL HIGH (ref 20.0–24.0)
O2 SAT: 95 %
O2 Saturation: 93 %
O2 Saturation: 91 %
PCO2 ART: 62.9 mmHg — AB (ref 35.0–45.0)
PH ART: 7.365 (ref 7.350–7.450)
Patient temperature: 97.5
Patient temperature: 97.6
Patient temperature: 98.7
TCO2: 10 mmol/L (ref 0–100)
TCO2: 19 mmol/L (ref 0–100)
TCO2: 29 mmol/L (ref 0–100)
pCO2 arterial: 17.6 mmHg — CL (ref 35.0–45.0)
pCO2 arterial: 31.7 mmHg — ABNORMAL LOW (ref 35.0–45.0)
pH, Arterial: 7.347 — ABNORMAL LOW (ref 7.350–7.450)
pH, Arterial: 7.24 — ABNORMAL LOW (ref 7.350–7.450)
pO2, Arterial: 75 mmHg — ABNORMAL LOW (ref 80.0–100.0)
pO2, Arterial: 65 mmHg — ABNORMAL LOW (ref 80.0–100.0)
pO2, Arterial: 75 mmHg — ABNORMAL LOW (ref 80.0–100.0)

## 2014-09-03 LAB — BASIC METABOLIC PANEL
BUN: 10 mg/dL (ref 6–20)
BUN: 12 mg/dL (ref 6–20)
BUN: 11 mg/dL (ref 6–20)
CALCIUM: 7.5 mg/dL — AB (ref 8.9–10.3)
CALCIUM: 7.4 mg/dL — AB (ref 8.9–10.3)
CO2: 25 mmol/L (ref 22–32)
CO2: 25 mmol/L (ref 22–32)
CO2: 25 mmol/L (ref 22–32)
CREATININE: 1.53 mg/dL — AB (ref 0.44–1.00)
CREATININE: 1.53 mg/dL — AB (ref 0.44–1.00)
Calcium: 7.2 mg/dL — ABNORMAL LOW (ref 8.9–10.3)
Chloride: >130 mmol/L (ref 101–111)
Creatinine, Ser: 1.66 mg/dL — ABNORMAL HIGH (ref 0.44–1.00)
GFR calc non Af Amer: 42 mL/min — ABNORMAL LOW (ref >60)
GFR calc non Af Amer: 42 mL/min — ABNORMAL LOW (ref >60)
GFR, EST AFRICAN AMERICAN: 44 mL/min — AB (ref >60)
GFR, EST AFRICAN AMERICAN: 48 mL/min — AB (ref >60)
GFR, EST AFRICAN AMERICAN: 48 mL/min — AB (ref >60)
GFR, EST NON AFRICAN AMERICAN: 38 mL/min — AB (ref >60)
Glucose, Bld: 280 mg/dL — ABNORMAL HIGH (ref 65–99)
Glucose, Bld: 277 mg/dL — ABNORMAL HIGH (ref 65–99)
Glucose, Bld: 264 mg/dL — ABNORMAL HIGH (ref 65–99)
POTASSIUM: 2.9 mmol/L — AB (ref 3.5–5.1)
Potassium: 3.5 mmol/L (ref 3.5–5.1)
Potassium: 3.6 mmol/L (ref 3.5–5.1)
SODIUM: 175 mmol/L — AB (ref 135–145)
SODIUM: 171 mmol/L — AB (ref 135–145)
SODIUM: 169 mmol/L — AB (ref 135–145)

## 2014-09-03 LAB — CBC
HCT: 37.7 % (ref 36.0–46.0)
HEMATOCRIT: 39 % (ref 36.0–46.0)
HEMOGLOBIN: 11.7 g/dL — AB (ref 12.0–15.0)
Hemoglobin: 12 g/dL (ref 12.0–15.0)
MCH: 22.3 pg — ABNORMAL LOW (ref 26.0–34.0)
MCH: 22.5 pg — AB (ref 26.0–34.0)
MCHC: 30.8 g/dL (ref 30.0–36.0)
MCHC: 31 g/dL (ref 30.0–36.0)
MCV: 72.4 fL — ABNORMAL LOW (ref 78.0–100.0)
MCV: 72.6 fL — ABNORMAL LOW (ref 78.0–100.0)
PLATELETS: 356 10*3/uL (ref 150–400)
Platelets: 337 10*3/uL (ref 150–400)
RBC: 5.39 MIL/uL — ABNORMAL HIGH (ref 3.87–5.11)
RBC: 5.19 MIL/uL — ABNORMAL HIGH (ref 3.87–5.11)
RDW: 17.9 % — ABNORMAL HIGH (ref 11.5–15.5)
RDW: 17.9 % — AB (ref 11.5–15.5)
WBC: 17.4 10*3/uL — AB (ref 4.0–10.5)
WBC: 17 10*3/uL — ABNORMAL HIGH (ref 4.0–10.5)

## 2014-09-03 LAB — URINE CULTURE: CULTURE: NO GROWTH

## 2014-09-03 LAB — TSH: TSH: 0.039 u[IU]/mL — ABNORMAL LOW (ref 0.350–4.500)

## 2014-09-03 LAB — LACTIC ACID, PLASMA: Lactic Acid, Venous: 3.3 mmol/L (ref 0.5–2.0)

## 2014-09-03 LAB — CLOSTRIDIUM DIFFICILE BY PCR: Toxigenic C. Difficile by PCR: NEGATIVE

## 2014-09-03 MED ORDER — FENTANYL CITRATE (PF) 100 MCG/2ML IJ SOLN
100.0000 ug | INTRAMUSCULAR | Status: DC | PRN
  Administered 2014-09-03: 100 ug via INTRAVENOUS
  Filled 2014-09-03: qty 2

## 2014-09-03 MED ORDER — ASPIRIN 81 MG PO CHEW
81.0000 mg | CHEWABLE_TABLET | Freq: Every day | ORAL | Status: DC
  Administered 2014-09-04 – 2014-09-05 (×2): 81 mg
  Filled 2014-09-03 (×2): qty 1

## 2014-09-03 MED ORDER — IOHEXOL 300 MG/ML  SOLN
25.0000 mL | Freq: Once | INTRAMUSCULAR | Status: AC | PRN
  Administered 2014-09-03: 25 mL via ORAL

## 2014-09-03 MED ORDER — SODIUM BICARBONATE 8.4 % IV SOLN
INTRAVENOUS | Status: DC
Start: 2014-09-03 — End: 2014-09-03
  Administered 2014-09-03: 06:00:00 via INTRAVENOUS
  Filled 2014-09-03 (×3): qty 150

## 2014-09-03 MED ORDER — MIDAZOLAM HCL 2 MG/2ML IJ SOLN
2.0000 mg | INTRAMUSCULAR | Status: DC | PRN
  Administered 2014-09-03 – 2014-09-04 (×6): 2 mg via INTRAVENOUS
  Filled 2014-09-03 (×6): qty 2

## 2014-09-03 MED ORDER — POTASSIUM CHLORIDE 10 MEQ/50ML IV SOLN
10.0000 meq | INTRAVENOUS | Status: AC
  Administered 2014-09-03 (×4): 10 meq via INTRAVENOUS
  Filled 2014-09-03 (×4): qty 50

## 2014-09-03 MED ORDER — LEVOTHYROXINE SODIUM 100 MCG IV SOLR
25.0000 ug | Freq: Every day | INTRAVENOUS | Status: DC
  Administered 2014-09-03 – 2014-09-04 (×2): 25 ug via INTRAVENOUS
  Filled 2014-09-03 (×2): qty 5

## 2014-09-03 MED ORDER — CETYLPYRIDINIUM CHLORIDE 0.05 % MT LIQD
7.0000 mL | Freq: Four times a day (QID) | OROMUCOSAL | Status: DC
  Administered 2014-09-03 – 2014-09-05 (×7): 7 mL via OROMUCOSAL

## 2014-09-03 MED ORDER — FENTANYL CITRATE (PF) 100 MCG/2ML IJ SOLN
100.0000 ug | INTRAMUSCULAR | Status: DC | PRN
  Administered 2014-09-03 – 2014-09-04 (×4): 100 ug via INTRAVENOUS
  Filled 2014-09-03 (×5): qty 2

## 2014-09-03 MED ORDER — HYDROCORTISONE NA SUCCINATE PF 100 MG IJ SOLR
50.0000 mg | Freq: Two times a day (BID) | INTRAMUSCULAR | Status: DC
  Administered 2014-09-03 – 2014-09-04 (×2): 50 mg via INTRAVENOUS
  Filled 2014-09-03 (×2): qty 1

## 2014-09-03 MED ORDER — SODIUM BICARBONATE 8.4 % IV SOLN
INTRAVENOUS | Status: AC
  Filled 2014-09-03: qty 50

## 2014-09-03 MED ORDER — SODIUM BICARBONATE 8.4 % IV SOLN
100.0000 meq | Freq: Once | INTRAVENOUS | Status: AC
  Administered 2014-09-03: 100 meq via INTRAVENOUS
  Filled 2014-09-03: qty 50

## 2014-09-03 MED ORDER — DESMOPRESSIN ACETATE 4 MCG/ML IJ SOLN
2.0000 ug | Freq: Two times a day (BID) | INTRAMUSCULAR | Status: DC
  Administered 2014-09-03 – 2014-09-04 (×3): 2 ug via INTRAVENOUS
  Filled 2014-09-03 (×5): qty 0.5

## 2014-09-03 MED ORDER — VITAL HIGH PROTEIN PO LIQD
1000.0000 mL | ORAL | Status: DC
  Filled 2014-09-03 (×2): qty 1000

## 2014-09-03 MED ORDER — PRO-STAT SUGAR FREE PO LIQD
30.0000 mL | Freq: Two times a day (BID) | ORAL | Status: DC
  Filled 2014-09-03 (×2): qty 30

## 2014-09-03 MED ORDER — VITAL HIGH PROTEIN PO LIQD
1000.0000 mL | ORAL | Status: DC
  Administered 2014-09-03: 1000 mL
  Filled 2014-09-03 (×3): qty 1000

## 2014-09-03 MED ORDER — MIDAZOLAM HCL 2 MG/2ML IJ SOLN
2.0000 mg | INTRAMUSCULAR | Status: DC | PRN
  Administered 2014-09-03: 2 mg via INTRAVENOUS
  Filled 2014-09-03: qty 2

## 2014-09-03 MED ORDER — POTASSIUM CHLORIDE 2 MEQ/ML IV SOLN
INTRAVENOUS | Status: DC
  Administered 2014-09-03 – 2014-09-04 (×4): via INTRAVENOUS
  Filled 2014-09-03 (×10): qty 1000

## 2014-09-03 MED ORDER — LORAZEPAM 2 MG/ML IJ SOLN: 2.0000 mg | INTRAMUSCULAR | Status: DC | PRN

## 2014-09-03 MED ORDER — LORAZEPAM 2 MG/ML IJ SOLN
INTRAMUSCULAR | Status: AC
  Administered 2014-09-03: 2 mg
  Filled 2014-09-03: qty 1

## 2014-09-03 MED ORDER — PRO-STAT SUGAR FREE PO LIQD
30.0000 mL | Freq: Every day | ORAL | Status: DC
  Administered 2014-09-03 – 2014-09-04 (×2): 30 mL
  Filled 2014-09-03 (×2): qty 30

## 2014-09-03 MED ORDER — DEXTROSE 5 % IV SOLN
INTRAVENOUS | Status: DC
  Administered 2014-09-04: via INTRAVENOUS

## 2014-09-03 MED ORDER — CHLORHEXIDINE GLUCONATE 0.12 % MT SOLN
15.0000 mL | Freq: Two times a day (BID) | OROMUCOSAL | Status: DC
  Administered 2014-09-03 – 2014-09-04 (×4): 15 mL via OROMUCOSAL
  Filled 2014-09-03 (×4): qty 15

## 2014-09-03 NOTE — Progress Notes (Signed)
eLink Physician-Brief Progress Note Patient Name: Cherye Gaertner DOB: [birth year] MRN: [id number]   Date of Service  09/03/2014  HPI/Events of Note  ABG = 7.34/17.6/75.0/9.7 c/w severe metabolic acidosis with respiratory compensation.   eICU Interventions  Will order: 1. NaHCO3 100 meq IV now. 2. Change IV fluid to D5 with 150 meq NaHCO3 per liter to run at 125 mL/hour.  3. Repeat ABG at 7 AM.     Intervention Category Major Interventions: Acid-Base disturbance - evaluation and management  Sommer,Steven Eugene 09/03/2014, 3:30 AM

## 2014-09-03 NOTE — Progress Notes (Signed)
Inpatient Diabetes Program Recommendations  AACE/ADA: New Consensus Statement on Inpatient Glycemic Control (2013)  Target Ranges:  Prepandial:   less than 140 mg/dL      Peak postprandial:   less than 180 mg/dL (1-2 hours)      Critically ill patients:  140 - 180 mg/dL   Review of Glycemic Control:  Results for SAPIR, LAVEY (MRN [id number]) as of 09/03/2014 12:59  Ref. Range 09/02/2014 21:35 09/03/2014 00:05 09/03/2014 03:55 09/03/2014 08:20 09/03/2014 12:50  Glucose-Capillary Latest Ref Range: 65-99 mg/dL 219 (H) 238 (H) 247 (H) 260 (H) 156 (H)   No history of diabetes. Note that patient is on IV solu-cortef, which is likely increasing blood sugars.  If blood glucose remains greater than 180 mg/dL, consider ICU glycemic control protocol Phase 2.   Thanks, Adah Perl, RN, BC-ADM Inpatient Diabetes Coordinator Pager [phone] (8a-5p)

## 2014-09-03 NOTE — Progress Notes (Signed)
CRITICAL VALUE ALERT  Critical value received:  Na 171 and Cl >130   Date of notification:  09/03/14  Time of notification:  2139  Critical value read back: Yes  Nurse who received alert: Polly Cobia RN   MD notified (1st page):  MD Mungal   Time of first page:  2145   MD notified (2nd page):  Time of second page:  Responding MD:  Lilian Kapur RN   Time MD responded: 2146

## 2014-09-03 NOTE — Significant Event (Signed)
CRITICAL VALUE ALERT  Critical value received: sodium > 180; chloride > 130  Date of notification: 09/03/14  Time of notification:  1603  Critical value read back:yes  Nurse who received alert: Lajoyce Corners, RN, AD/Katrice Jake Michaelis, RN  MD notified: Dr Berenda Morale

## 2014-09-03 NOTE — Procedures (Signed)
Oral Intubation Procedure Note   Procedure: Intubation Indications: AMS Consent: Obtained from mother Time Out: Verified patient identification, verified procedure, site/side was marked, verified correct patient position, special equipment/implants available, medications/allergies/relevent history reviewed, required imaging and test results available.   Pre-meds: Etomidate 20 mg IV  Neuromuscular blockade: Rocuronium 50 mg IV  Laryngoscope: #3 MAC  Visualization: cords fully visualized  ETT: 7.5 ETT passed on first attempt and secured @ 20 cm at upper gums  Findings: normal airway   Evaluation:  CXR revealed proper position of ETT  Pt tolerated procedure well without complications   Merton Border, MD ; [facility] service Mobile [phone].  After 5:30 PM or weekends, call [phone]

## 2014-09-03 NOTE — Progress Notes (Signed)
Notified Dr. Alva Garnet of pt's major mental status change from last night. Pt nonverbal and pupils dilated. Family at bedside.

## 2014-09-03 NOTE — Progress Notes (Signed)
eLink Physician-Brief Progress Note Patient Name: Beth Fitzgerald DOB: 31-Mar-1975 MRN: [id number]   Date of Service  09/03/2014  HPI/Events of Note  Patient with DI with Na as high as 180 now 169.  On D5W at high rate but continues to receive NS at [place] Interventions  Plan: Change all fluids to D5W     Intervention Category Major Interventions: Electrolyte abnormality - evaluation and management  [name] 09/03/2014, 11:46 PM

## 2014-09-03 NOTE — Progress Notes (Signed)
Bedside EEG completed; results pending. 

## 2014-09-03 NOTE — Procedures (Signed)
EEG report.  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 Admited via ED 7/06 to ICU/PCCM service with one day history of N/V/D, lactic acidosis, fever, and altered mental status.   Technique: this is a 17 channel routine scalp EEG performed at the bedside in the ICU, with bipolar and monopolar montages arranged in accordance to the international 10/20 system of electrode placement. One channel was dedicated to EKG recording.  The patient is intubated on the vent No activating procedures performed.  Description: there is background disorganization and the entirety of the recording demonstrated generalized and continuous theta slowing predominantly 6-7 Hz range. There is not evidence of electrographic seizures. No focal or generalized epileptiform discharges noted.   EKG showed sinus rhythm.  Impression: this is an abnormal EEG with findings supportive of a mild encephalopathy, non specific as to cause. No evidence of electrographic seizures.  Clinical correlation is advised.   Dorian Pod, MD [facility]

## 2014-09-03 NOTE — Progress Notes (Signed)
CRITICAL VALUE ALERT  Critical value received: Na 169 and Cl >130   Date of notification:  09/03/14  Time of notification: 2337  Critical value read back: Yes  Nurse who received alert: Polly Cobia RN   MD notified (1st page):  MD Mungal   Time of first page: 2339  MD notified (2nd page):  Time of second page:  Responding MD:  Nida Boatman RN   Time MD responded:  [phone]

## 2014-09-03 NOTE — Progress Notes (Signed)
Unable to place OG tube despite multiple attempts; Dr Alva Garnet notified; order received to place panda under flouroscopy; radiology notified.  Lenor Coffin, RN

## 2014-09-03 NOTE — Progress Notes (Signed)
eLink Physician-Brief Progress Note Patient Name: Beth Fitzgerald DOB: 03/05/1975 MRN: [id number]   Date of Service  09/03/2014  HPI/Events of Note  RR = 40's and HCO3- = 15. Patient is confused and on a NaHCO3 IV infusion.   eICU Interventions  Will check an ABG now.      Intervention Category Major Interventions: Acid-Base disturbance - evaluation and management  [name] 09/03/2014, 1:38 AM

## 2014-09-03 NOTE — Progress Notes (Signed)
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 Admited via ED 7/06 to ICU/PCCM service with one day history of N/V/D, lactic acidosis, fever. Concern for occult shock.  STUDIES:  CXR 7/6: low lung volumes CXR 7/7: bilateral patchy infiltrates  SIGNIFICANT EVENTS: 09/02/14: Admitted to ICU 09/03/14: Intubated  SUBJECTIVE: Acidosis worsened overnight. Was given Bicarb bolus and Bicarb was also added to fluids with improvement of acidosis. Family noted Mrs. Culley was less responsive this morning.   VITAL SIGNS: Temp:  [97.3 F (36.3 C)-104 F (40 C)] 98.8 F (37.1 C) (07/07 0824) Pulse Rate:  [110-154] 123 (07/07 0900) Resp:  [16-57] 32 (07/07 0900) BP: (98-179)/(44-99) 137/84 mmHg (07/07 0900) SpO2:  [90 %-100 %] 92 % (07/07 0900) Weight:  [158 lb 4.6 oz (71.8 kg)-161 lb (73.029 kg)] 158 lb 4.6 oz (71.8 kg) (07/07 0410) HEMODYNAMICS: CVP:  [5 mmHg-9 mmHg] 9 mmHg VENTILATOR SETTINGS:   INTAKE / OUTPUT:  Intake/Output Summary (Last 24 hours) at 09/03/14 0914 Last data filed at 09/03/14 0600  Gross per 24 hour  Intake 5340.83 ml  Output   3575 ml  Net 1765.83 ml    PHYSICAL EXAMINATION: General: 39yo female less responsive than at baseline Neuro:  Does not follow commands or voice. Occasional tremor noted HEENT:  Pupils dilated bilaterally but reactive to light. Nystagmus noted. Cardiovascular:  S1 and S2 noted. No murmurs. Tachycardic. Lungs:  CTAB. Equal bilaterally. Increased work of breathing noted.  Abdomen:  Soft and nondistended. Bowel sounds noted.  Musculoskeletal:  No edema noted Skin:  No rashes  noted.  LABS:  CBC  Recent Labs Lab 09/02/14 1100  WBC 15.2*  HGB 14.4  HCT 45.0  PLT 721*   Coag's No results for input(s): APTT, INR in the last 168 hours. BMET  Recent Labs Lab 09/02/14 1100  NA 141  K 3.9  CL 111  CO2 15*  BUN 16  CREATININE 1.54*  GLUCOSE 99   Electrolytes  Recent Labs Lab 09/02/14 1100  CALCIUM 8.1*   Sepsis Markers  Recent Labs Lab 09/02/14 1138 09/02/14 1457 09/02/14 1820  LATICACIDVEN 3.61* 4.09*  --   PROCALCITON  --   --  1.57   ABG  Recent Labs Lab 09/03/14 0153 09/03/14 0619  PHART 7.347* 7.365  PCO2ART 17.6* 31.7*  PO2ART 75.0* 65.0*   Liver Enzymes  Recent Labs Lab 09/02/14 1100  AST 28  ALT 17  ALKPHOS 71  BILITOT 0.6  ALBUMIN 3.1*   Cardiac Enzymes No results for input(s): TROPONINI, PROBNP in the last 168 hours. Glucose  Recent Labs Lab 09/02/14 1719 09/02/14 2135 09/03/14 0005 09/03/14 0355 09/03/14 0820  GLUCAP 141* 219* 238* 247* 260*   Imaging Dg Chest Port 1 View  09/03/2014   CLINICAL DATA:  Respiratory distress.  Shortness of breath .  EXAM: PORTABLE CHEST - 1 VIEW  COMPARISON:  09/02/2014.  FINDINGS: Right PICC line noted with tip at cavoatrial junction. Mediastinum hilar structures are normal. Heart size normal. Bilateral patchy pulmonary infiltrates suggesting pneumonia. Low lung volumes with basilar atelectasis. No prominent pleural effusion. No pneumothorax.  IMPRESSION: 1. PICC line in good anatomic position. 2. Patchy bilateral pulmonary infiltrates  suggesting pneumonia. 3. Low lung volumes with basilar atelectasis.   Electronically Signed   By: Marcello Moores  Register   On: 09/03/2014 07:11   Dg Chest Port 1 View  09/02/2014   CLINICAL DATA:  39 year old female with shortness of breath and cough. Initial encounter.  EXAM: PORTABLE CHEST - 1 VIEW  COMPARISON:  None.  FINDINGS: Portable AP semi upright view at 1311 hrs. Low lung volumes. Normal cardiac size and mediastinal contours. Visualized  tracheal air column is within normal limits. No pneumothorax, pulmonary edema, pleural effusion or confluent pulmonary opacity. Levoconvex thoracolumbar scoliosis. No acute osseous abnormality identified.  IMPRESSION: Low lung volumes, otherwise no acute cardiopulmonary abnormality.   Electronically Signed   By: Genevie Ann M.D.   On: 09/02/2014 13:20    ASSESSMENT / PLAN:  PULMONARY OETT 09/03/14 A: Acute Hypoxic Respiratory Failure, now Ventilator Dependent ALI vs. Edema  P:   Intubated 09/03/14 Vent Settings: PRVC Rate 16, Vt j8cc/kg, PEEP 5, Adjust FiO2 to keep between 92-97% Follow up post-intubation ABG and adjust ventilator accordingly Follow up CXR Steroids  CARDIOVASCULAR CVL: PICC 7/6 A:  Hypotension Sinus Tachycardia Septic Shock  P:  Monitor HR and BP 1L NS bolus. If no improvement in BP, consider pressors CVP Goal 8-12  RENAL A:   AKI (Creatinine 1.54) Anion Gap Metabolic Acidosis, Elevated Lactate (4.09)  P:   Follow CMP Sodium Bicarb in D5 @50  Correct electrolytes as indicated  GASTROINTESTINAL A:   Nausea/Vomiting/Diarrhea  P:   Place NG tube. Initiate tube feeds after placement. Pepcid  HEMATOLOGIC A:  Leukocytosis (WBC 15.2) P:  Follow CBC SCD. Lovenox  INFECTIOUS A:   Sepsis. qSOFA 3. Lactate 4.09 Gastroenteritis  P:   Urine 7/06 >>  C diff 7/06 >> negative Enteric pathogens 7/06 >>  Blood 7/06 >>   Vanc 7/06 >>  Metronidazole 7/06 >>  Ciproflixacin 7/06 >>   Follow up Lactic Acid  ENDOCRINE A:   Hyperglycemia, likely secondary to steroids H/O Panhypopituirarism Hyperthyroidism (TSH 0.039)  P:   Monitor CBGs Moderate Insulin Sliding Scale Levothyroxine. Half of home dose. Repeat TSH in am. Lab reports initial lab contaminated.  NEUROLOGIC A:   Altered Mental Status Concern for Possible Seizures Hearing Loss at Baseline  P:   Follow up EEG. Consider Neurology consult if abnormal. Follow up CT head w/o  contrast Fentanyl and Versed PRN Ativan 2mg  PRN seizures RASS goal: -1  FAMILY  - Updates: Mother at bedside.  - Inter-disciplinary family meet or Palliative Care meeting due by: 09/08/14.  TODAY'S SUMMARY: Intubated today. Monitor BP. If no improvement with fluids consider Arterial line. Follow up CBC and CMP. Follow up EEG and head CT. Ativan PRN seizures. Continue to monitor mental status.  Junie Panning, DO Family Medicine, PGY-2 Pager: [phone]  09/03/2014, 9:14 AM   PCCM ATTENDING: I have reviewed pt's initial presentation, consultants notes and hospital database in detail.  The above assessment and plan was formulated under my direction.  In summary: Very complicated day. Upon my initial evaluation this AM, she was in less respiratory distress than yesterday but was confused and poorly oriented. Over the next hour or so, she became progressively obtunded and her exam revealed nystagmus and ankle clonus. I intubated her for transport to CT scanner (CT revealed no explanation for her AMS). An EEG was performed with no evidence of seizure or a seizure focus. It was noted that her AM labs were not reported. These were repeated and again took a  long time to result. Ultimately, they were repeated again (approx 1230 @ this time) and the Na was documented as > 180. When this result became available, she was immediately started on desmopressin and D5W. I have subsequently reviewed her I/Os and, although her Uo is documented as being abnormally high, her net I/Os are documented as positive. It is clear that she has DI but that does not explain such profound hypernatremia without markedly negative I/Os  Her current problem list is as follows: Panhypopituitarism Acute gastroenteritis Severe sepsis Mild lactic acidosis Acute resp failure due to AMS ALI vs pulm edema Diabetes insipidus Profound hypernatremia Hypokalemia AKI, nonoliguric Steroid induced hyperglycemia Acute  encephalopathy due to hypernatremia  Her plan includes: Cont stress dose steroids Cont empiric abx - narrow as able Vent support MAP goal > 65 mmHg Aggressive free water repletion Desmopressin initiated today Minimize sedating agents Might need MRI of brain depending on clinical recovery  I have updated her mother on 3 occasions over the course of the day and have spoken with Dr Merrilyn Puma, an endocrinologist from [facility] who cares for her   60 minutes of independent CCM time was provided by me   Merton Border, MD;  PCCM service; Mobile [phone]

## 2014-09-03 NOTE — Progress Notes (Signed)
CRITICAL VALUE ALERT  Critical value received:  Na 175 and Cl >130  Date of notification:  09/03/14 Time of notification:  1925  Critical value read back: Yes   Nurse who received alert: Polly Cobia RN   MD notified (1st page): MD Mungal   Time of first page: 1935  MD notified (2nd page):  Time of second page:  Responding MD: Nida Boatman RN   Time MD responded:  [phone]

## 2014-09-03 NOTE — Procedures (Signed)
Intubation Procedure Note Beth Fitzgerald [id number] September 27, 1975  Procedure: Intubation Indications: Airway protection and maintenance  Procedure Details Consent: Unable to obtain consent because of altered level of consciousness. Time Out: Verified patient identification, verified procedure, site/side was marked, verified correct patient position, special equipment/implants available, medications/allergies/relevent history reviewed, required imaging and test results available.  Performed  Maximum sterile technique was used including cap, gloves, gown, hand hygiene and mask.  MAC and 3    Evaluation Hemodynamic Status: BP hypotensive; O2 sats: stable throughout Patient's Current Condition: stable Complications: No apparent complications Patient did tolerate procedure well. Chest X-ray ordered to verify placement.  CXR: pending.   Beth Fitzgerald 09/03/2014

## 2014-09-03 NOTE — Progress Notes (Signed)
Initial Nutrition Assessment  DOCUMENTATION CODES:  Obesity unspecified  INTERVENTION:   Initiate TF via OGT with Vital High Protein at 25 ml/h and Prostat 30 ml once daily on day 1; on day 2, increase to goal rate of 35 ml/h (840 ml per day) to provide 940 kcals, 89 gm protein, 702 ml free water daily.  NUTRITION DIAGNOSIS:  Inadequate oral intake related to inability to eat as evidenced by NPO status.  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 Admited via ED 7/6 to ICU with one day history of N/V/D, lactic acidosis, fever. Concern for occult shock. Intubated this AM with acute hypoxic respiratory failure, ALI vs edema.  Received MD Consult for TF initiation and management.  Unable to complete nutrition-focused physical exam at this time.   Patient is currently intubated on ventilator support MV: 5.5 L/min Temp (24hrs), Avg:98.6 F (37 C), Min:97.3 F (36.3 C), Max:102.9 F (39.4 C)  Propofol: none  Height:  Ht Readings from Last 1 Encounters:  09/02/14 5' (1.524 m)    Weight:  Wt Readings from Last 1 Encounters:  09/03/14 158 lb 4.6 oz (71.8 kg)    Ideal Body Weight:  45.5 kg  Wt Readings from Last 10 Encounters:  09/03/14 158 lb 4.6 oz (71.8 kg)    BMI:  Body mass index is 30.91 kg/(m^2).  Estimated Nutritional Needs:  Kcal:  [phone]  Protein:  90 gm  Fluid:  1.5 L  Skin:  Reviewed, no issues  Diet Order:   NPO  EDUCATION NEEDS:  No education needs identified at this time   Intake/Output Summary (Last 24 hours) at 09/03/14 1159 Last data filed at 09/03/14 1100  Gross per 24 hour  Intake 6065.83 ml  Output   3925 ml   Net 2140.83 ml    Last BM:  7/7   Molli Barrows, RD, LDN, [place] Pager [phone] After Hours Pager [phone]

## 2014-09-04 ENCOUNTER — Inpatient Hospital Stay (HOSPITAL_COMMUNITY): Payer: Medicare Other

## 2014-09-04 LAB — BASIC METABOLIC PANEL
ANION GAP: 5 (ref 5–15)
Anion gap: 6 (ref 5–15)
BUN: 12 mg/dL (ref 6–20)
BUN: 14 mg/dL (ref 6–20)
BUN: 11 mg/dL (ref 6–20)
BUN: 12 mg/dL (ref 6–20)
CALCIUM: 7.1 mg/dL — AB (ref 8.9–10.3)
CO2: 22 mmol/L (ref 22–32)
CO2: 23 mmol/L (ref 22–32)
CO2: 25 mmol/L (ref 22–32)
CO2: 26 mmol/L (ref 22–32)
CREATININE: 1.4 mg/dL — AB (ref 0.44–1.00)
CREATININE: 1.25 mg/dL — AB (ref 0.44–1.00)
CREATININE: 1.29 mg/dL — AB (ref 0.44–1.00)
Calcium: 6.9 mg/dL — ABNORMAL LOW (ref 8.9–10.3)
Calcium: 7.1 mg/dL — ABNORMAL LOW (ref 8.9–10.3)
Calcium: 7.3 mg/dL — ABNORMAL LOW (ref 8.9–10.3)
Chloride: >130 mmol/L (ref 101–111)
Chloride: 128 mmol/L — ABNORMAL HIGH (ref 101–111)
Chloride: 125 mmol/L — ABNORMAL HIGH (ref 101–111)
Creatinine, Ser: 1.43 mg/dL — ABNORMAL HIGH (ref 0.44–1.00)
GFR calc Af Amer: 52 mL/min — ABNORMAL LOW (ref >60)
GFR calc Af Amer: 54 mL/min — ABNORMAL LOW (ref >60)
GFR calc non Af Amer: 46 mL/min — ABNORMAL LOW (ref >60)
GFR calc non Af Amer: 53 mL/min — ABNORMAL LOW (ref >60)
GFR calc non Af Amer: 51 mL/min — ABNORMAL LOW (ref >60)
GFR, EST AFRICAN AMERICAN: 59 mL/min — AB (ref >60)
GFR, EST NON AFRICAN AMERICAN: 45 mL/min — AB (ref >60)
GLUCOSE: 257 mg/dL — AB (ref 65–99)
Glucose, Bld: 272 mg/dL — ABNORMAL HIGH (ref 65–99)
Glucose, Bld: 150 mg/dL — ABNORMAL HIGH (ref 65–99)
Glucose, Bld: 132 mg/dL — ABNORMAL HIGH (ref 65–99)
POTASSIUM: 3.9 mmol/L (ref 3.5–5.1)
Potassium: 3.9 mmol/L (ref 3.5–5.1)
Potassium: 3.6 mmol/L (ref 3.5–5.1)
Potassium: 3.5 mmol/L (ref 3.5–5.1)
SODIUM: 152 mmol/L — AB (ref 135–145)
Sodium: 157 mmol/L — ABNORMAL HIGH (ref 135–145)
Sodium: 165 mmol/L (ref 135–145)
Sodium: 168 mmol/L (ref 135–145)

## 2014-09-04 LAB — PROCALCITONIN: PROCALCITONIN: 0.98 ng/mL

## 2014-09-04 LAB — COMPREHENSIVE METABOLIC PANEL
ALBUMIN: 2 g/dL — AB (ref 3.5–5.0)
ALT: 18 U/L (ref 14–54)
AST: 26 U/L (ref 15–41)
Alkaline Phosphatase: 51 U/L (ref 38–126)
BUN: 11 mg/dL (ref 6–20)
CALCIUM: 7.1 mg/dL — AB (ref 8.9–10.3)
CO2: 24 mmol/L (ref 22–32)
Chloride: >130 mmol/L (ref 101–111)
Creatinine, Ser: 1.35 mg/dL — ABNORMAL HIGH (ref 0.44–1.00)
GFR calc Af Amer: 56 mL/min — ABNORMAL LOW (ref >60)
GFR calc non Af Amer: 48 mL/min — ABNORMAL LOW (ref >60)
GLUCOSE: 237 mg/dL — AB (ref 65–99)
Potassium: 4 mmol/L (ref 3.5–5.1)
Sodium: 162 mmol/L (ref 135–145)
Total Bilirubin: 0.3 mg/dL (ref 0.3–1.2)
Total Protein: 4.7 g/dL — ABNORMAL LOW (ref 6.5–8.1)

## 2014-09-04 LAB — CBC
HCT: 30.8 % — ABNORMAL LOW (ref 36.0–46.0)
Hemoglobin: 9.5 g/dL — ABNORMAL LOW (ref 12.0–15.0)
MCH: 22.7 pg — ABNORMAL LOW (ref 26.0–34.0)
MCHC: 30.8 g/dL (ref 30.0–36.0)
MCV: 73.5 fL — ABNORMAL LOW (ref 78.0–100.0)
Platelets: 235 10*3/uL (ref 150–400)
RBC: 4.19 MIL/uL (ref 3.87–5.11)
RDW: 18.5 % — ABNORMAL HIGH (ref 11.5–15.5)
WBC: 17.2 10*3/uL — ABNORMAL HIGH (ref 4.0–10.5)

## 2014-09-04 LAB — GLUCOSE, CAPILLARY
GLUCOSE-CAPILLARY: 187 mg/dL — AB (ref 65–99)
GLUCOSE-CAPILLARY: 116 mg/dL — AB (ref 65–99)
GLUCOSE-CAPILLARY: 132 mg/dL — AB (ref 65–99)
Glucose-Capillary: 157 mg/dL — ABNORMAL HIGH (ref 65–99)
Glucose-Capillary: 224 mg/dL — ABNORMAL HIGH (ref 65–99)
Glucose-Capillary: 224 mg/dL — ABNORMAL HIGH (ref 65–99)
Glucose-Capillary: >600 mg/dL (ref 65–99)

## 2014-09-04 LAB — TSH: TSH: 0.114 u[IU]/mL — ABNORMAL LOW (ref 0.350–4.500)

## 2014-09-04 MED ORDER — INSULIN ASPART 100 UNIT/ML ~~LOC~~ SOLN: 0.0000 [IU] | Freq: Three times a day (TID) | SUBCUTANEOUS | Status: DC

## 2014-09-04 MED ORDER — INSULIN ASPART 100 UNIT/ML ~~LOC~~ SOLN
0.0000 [IU] | SUBCUTANEOUS | Status: DC
  Administered 2014-09-04: 3 [IU] via SUBCUTANEOUS

## 2014-09-04 MED ORDER — DEXTROSE 5 % IV SOLN
INTRAVENOUS | Status: DC
  Administered 2014-09-04 – 2014-09-05 (×4): via INTRAVENOUS
  Filled 2014-09-04 (×6): qty 1000

## 2014-09-04 MED ORDER — HYDROCORTISONE NA SUCCINATE PF 100 MG IJ SOLR
25.0000 mg | Freq: Two times a day (BID) | INTRAMUSCULAR | Status: DC
  Administered 2014-09-04 – 2014-09-05 (×2): 25 mg via INTRAVENOUS
  Filled 2014-09-04: qty 2
  Filled 2014-09-04 (×2): qty 0.5

## 2014-09-04 MED ORDER — LEVOTHYROXINE SODIUM 100 MCG IV SOLR
50.0000 ug | Freq: Every day | INTRAVENOUS | Status: DC
  Administered 2014-09-05: 50 ug via INTRAVENOUS
  Filled 2014-09-04 (×2): qty 5

## 2014-09-04 MED ORDER — INSULIN ASPART 100 UNIT/ML ~~LOC~~ SOLN: 0.0000 [IU] | Freq: Every day | SUBCUTANEOUS | Status: DC

## 2014-09-04 NOTE — Progress Notes (Signed)
CRITICAL VALUE ALERT  Critical value received: Na 162, CL>130  Date of notification:  09/04/2014  Time of notification:  0430  Critical value read back: Yes  Nurse who received alert: Polly Cobia RN   MD notified (1st page): MD Gerlean Ren   Time of first page:  0445  MD notified (2nd page):  Time of second page:  Responding MD:  MD Gerlean Ren   Time MD responded:  [phone]

## 2014-09-04 NOTE — Progress Notes (Signed)
CRITICAL VALUE ALERT  Critical value received: Na 168, and Cl > 130  Date of notification:  09/04/14  Time of notification:  0049  Critical value read back: Yes  Nurse who received alert: Polly Cobia RN   MD notified (1st page):  MD Gerlean Ren   Time of first page:  0050  MD notified (2nd page):  Time of second page:  Responding MD:  MD Gerlean Ren   Time MD responded:  [phone]

## 2014-09-04 NOTE — Progress Notes (Signed)
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 Admited via ED 7/06 to ICU/PCCM service with one day history of N/V/D, lactic acidosis, fever. Concern for occult shock.  STUDIES:  CXR 7/6: low lung volumes CXR 7/7: bilateral patchy infiltrates EEG 7/7: mild encephalopathy CT Head 7/7: small high density focus in left frontal lobe consistent with prior surgery, moderate cerebral atrophy KUB 7/7: NG tube in place CXR 7/8: bibasilar atelectasis  Infectious Disease: Urine 7/06 >> No Growth C diff 7/06 >> negative Blood 7/06 >> NGTD  SIGNIFICANT EVENTS: 7/6: Admitted to ICU 7/7: Acute change in mental status. Intubated. EEG with encephalopathy. CT with no acute changes. NG tube placed. CMP with hypernatremia, hypokalemia, hyperchloremia. 7/8: Significant improvement in mental status. Extubated.  SUBJECTIVE: CMP trended overnight with improvement in sodium, potassium, and creatinine, while chloride remains elevated >130. All fluids changed to D5W. No further acute events overnight.  VITAL SIGNS: Temp:  [98.3 F (36.8 C)-99.4 F (37.4 C)] 98.3 F (36.8 C) (07/08 0500) Pulse Rate:  [105-140] 105 (07/08 0700) Resp:  [15-36] 24 (07/08 0700) BP: (75-175)/(41-137) 137/86 mmHg (07/08 0700) SpO2:  [90 %-100 %] 99 % (07/08 0700) FiO2 (%):  [40 %-100 %] 40 % (07/08 0700) Weight:  [165 lb 5.5 oz (75 kg)] 165 lb 5.5 oz (75 kg) (07/08 0500) HEMODYNAMICS: CVP:  [3 mmHg-13 mmHg] 11 mmHg VENTILATOR SETTINGS: Vent Mode:  [-] PRVC FiO2 (%):  [40 %-100 %] 40 % Set Rate:  [16 bmp] 16 bmp Vt Set:  [370 mL] 370 mL PEEP:  [5 cmH20] 5  cmH20 Plateau Pressure:  [16 cmH20-18 cmH20] 16 cmH20 INTAKE / OUTPUT:  Intake/Output Summary (Last 24 hours) at 09/04/14 0719 Last data filed at 09/04/14 0700  Gross per 24 hour  Intake 6954.5 ml  Output    735 ml  Net 6219.5 ml    PHYSICAL EXAMINATION: General: 39yo female alert and resting comfortably Neuro:  Follows commands, no focal deficits, improved alertness HEENT:  PERRLA, dry mucous membranes. NG tube in place Cardiovascular:  S1 and S2 noted. No murmurs. Tachycardic. Lungs:  CTAB. Equal bilaterally. No increased work of breathing. Abdomen:  Soft and nondistended. Bowel sounds noted.  Musculoskeletal:  No edema noted Skin:  No rashes noted.  LABS:  CBC  Recent Labs Lab 09/03/14 1230 09/03/14 1509 09/04/14 0430  WBC 17.4* 17.0* 17.2*  HGB 12.0 11.7* 9.5*  HCT 39.0 37.7 30.8*  PLT 356 337 235   Coag's No results for input(s): APTT, INR in the last 168 hours. BMET  Recent Labs Lab 09/04/14 09/04/14 0218 09/04/14 0430  NA 168* 165* 162*  K 3.9 3.9 4.0  CL >130* >130* >130*  CO2 26 25 24   BUN 12 11 11   CREATININE 1.43* 1.40* 1.35*  GLUCOSE 257* 272* 237*   Electrolytes  Recent Labs Lab 09/04/14 09/04/14 0218 09/04/14 0430  CALCIUM 7.3* 7.1* 7.1*   Sepsis Markers  Recent Labs Lab 09/02/14 1138 09/02/14 1457 09/02/14 1820 09/03/14 1017 09/04/14 0430  LATICACIDVEN 3.61* 4.09*  --  3.3*  --   PROCALCITON  --   --  1.57  --  0.98   ABG  Recent Labs Lab  09/03/14 0153 09/03/14 0619 09/03/14 1204  PHART 7.347* 7.365 7.240*  PCO2ART 17.6* 31.7* 62.9*  PO2ART 75.0* 65.0* 75.0*   Liver Enzymes  Recent Labs Lab 09/02/14 1100 09/03/14 1509 09/04/14 0430  AST 28 29 26   ALT 17 19 18   ALKPHOS 71 53 51  BILITOT 0.6 0.5 0.3  ALBUMIN 3.1* 2.3* 2.0*   Cardiac Enzymes No results for input(s): TROPONINI, PROBNP in the last 168 hours. Glucose  Recent Labs Lab 09/03/14 1250 09/03/14 1630 09/03/14 2012 09/03/14 2016 09/03/14 2341  09/04/14 0437  GLUCAP 156* 158* >600* 214* 224* 224*   Imaging Dg Abd 1 View  09/03/2014   CLINICAL DATA:  Feeding tube placement by the fluoroscopy technologist.  EXAM: ABDOMEN - 1 VIEW  COMPARISON:  None.  FINDINGS: A single C-arm view of the mid abdomen demonstrates a feeding tube and injected contrast. The injected contrast is filling the duodenum and proximal jejunum. The feeding tube tip is at the junction of the second and third portions of the duodenum.  IMPRESSION: Feeding tube tip at the junction of the second and third portions of the duodenum.   Electronically Signed   By: Claudie Revering M.D.   On: 09/03/2014 15:36   Ct Head Wo Contrast  09/03/2014   CLINICAL DATA:  Altered mental status  EXAM: CT HEAD WITHOUT CONTRAST  TECHNIQUE: Contiguous axial images were obtained from the base of the skull through the vertex without intravenous contrast.  COMPARISON:  None.  FINDINGS: No skull fracture is noted. There is mucosal thickening with partial opacification left maxillary sinus. The mastoid air cells are unremarkable.  No intracranial hemorrhage, mass effect or midline shift. Moderate cerebral atrophy. Mild periventricular white matter decreased attenuation probable due to chronic small vessel ischemic changes. Extensive bilateral basal ganglia calcifications are noted. No definite acute cortical infarction. No mass lesion is noted on this unenhanced scan. Small punctate calcification is noted in left thalamus. There is small high density focus in left frontal lobe measures 81 Hounsfield units in attenuation. Given extensive calcification in basal ganglia this may represent a small calcification however focal small hemorrhage may have the same appearance. Clinical correlation is necessary.  IMPRESSION: There is no intraventricular hemorrhage. There is small high density focus in left frontal lobe measures 81 Hounsfield units in attenuation. Given extensive calcification in basal ganglia this may  represent a small calcification however focal small hemorrhage may have the same appearance. Clinical correlation is necessary. Extensive bilateral basal ganglia calcifications are noted. Mucosal thickening with partial opacification left maxillary sinus. Moderate cerebral atrophy. Periventricular white matter decreased attenuation probable due to chronic small vessel ischemic changes. No definite acute cortical infarction.   Electronically Signed   By: Lahoma Crocker M.D.   On: 09/03/2014 11:19   Portable Chest Xray  09/03/2014   CLINICAL DATA:  Endotracheal tube placement  EXAM: PORTABLE CHEST - 1 VIEW  COMPARISON:  09/03/2014  FINDINGS: Cardiomediastinal silhouette is stable. Stable right arm PICC line position. There is endotracheal tube in place with tip 1.7 cm above the carina. No pneumothorax. Persistent patchy bilateral lower lobe infiltrates.  IMPRESSION: Stable right arm PICC line position. There is endotracheal tube in place with tip 1.7 cm above the carina. No pneumothorax. Persistent patchy bilateral lower lobe infiltrates.   Electronically Signed   By: Lahoma Crocker M.D.   On: 09/03/2014 10:54   Dg Addison Bailey G Tube Plc W/fl-no Rad  09/03/2014   CLINICAL DATA:    NASO G TUBE PLACEMENT  WITH FLUORO  Fluoroscopy was utilized by the requesting physician.  No radiographic  interpretation.     ASSESSMENT / PLAN:  PULMONARY OETT 09/03/14 A: ALI vs. Pulmonary Edema  P:   Intubated 7/7. Extubated 7/8 Steroids  CARDIOVASCULAR CVL: PICC 7/6 A:  Initially Hypotensive--Resolved Sinus Tachycardia Possible Septic Shock  P:  Monitor HR and BP Lovenox  RENAL A:   Diabetes Insipidus AKI (Creatinine 1.35)--Improving Anion Gap Metabolic Acidosis, Elevated Lactate (3.3) Severe Hypernatremia (162)--Improving Hyperchloremia (>130)  P:   Desmopressin IV. Switch to nasal once Na less than 150 Follow CMP q6hr x2 and then space to q12 D5W with 20 KCl @150  Calculate free water with each CMP and adjust  fluids accordingly. Currently 5.9. Correct electrolytes as indicated  GASTROINTESTINAL A:   Gastroenteritis  P:   NG tube in place.  TF discontinued Re-evaluate this afternoon and consider removal of NG May have sips   HEMATOLOGIC A:  Leukocytosis (WBC 17.2) Anemia (9.5)  P:  Follow CBC SCD. Lovenox  INFECTIOUS A:   Possible Sepsis although viral etiology more likely with Gastro. qSOFA 3. Lactate 4.09 Gastroenteritis  P:   Vanc 7/06 >> 7/8 Metronidazole 7/06 >>  7/8 Ciproflixacin 7/06 >> 7/8  Discontinuing above antibiotics due to high likelihood of viral etiology with gastroenteritis. If clinical status worsens, consider restarting abx.  ENDOCRINE A:   Hyperglycemia, likely secondary to steroids H/O Panhypopituirarism Hyperthyroidism (TSH 0.114)  P:   Monitor CBGs Moderate Insulin Sliding Scale Levothyroxine.   NEUROLOGIC A:   Acute Encephalopathy due to Hypernatremia Hearing Loss at Baseline  P:   Continue to monitor for improvement RASS Goal 0  FAMILY  - Updates: Mother at bedside.  - Inter-disciplinary family meet or Palliative Care meeting due by: 09/08/14.  TODAY'S SUMMARY: Extubated today. Monitor BP and HR. Follow up CMP at 12:30pm and again at 6:30pm and adjust fluids accordingly based on free water calculation. Continue to monitor mental status.  Junie Panning, DO Family Medicine, PGY-2 Pager: [phone]  09/04/2014, 7:19 AM   PCCM ATTENDING: I have reviewed pt's initial presentation, consultants notes and hospital database in detail.  The above assessment and plan was formulated under my direction.  In summary: Panhypopituitarism Admitted with gastroenteritis and severe sepsis, NOS  Suspect viral gastroenteritis AKI, now resolving Hospitalization c/b severe hypernatremia and AMS requiring intubation  CT head negative  EEG negative for seizure focus Hypernatremia improving Acute encephalopathy much improved  Extubated  this AM and tolerating Continue free water repletion Cont IV desmopressin until Na < 145  Then convert back to nasal spray Advance diet and activity DC abx Mother and father updated in detail   40 minutes of independent CCM time was provided by me   Merton Border, MD;  PCCM service; Mobile [phone]

## 2014-09-04 NOTE — Progress Notes (Signed)
CRITICAL VALUE ALERT  Critical value received: Na 165 and Cl >130  Date of notification:  09/04/14  Time of notification:  0249   Critical value read back: Yes   Nurse who received alert:  Polly Cobia RN   MD notified (1st page): MD Gerlean Ren   Time of first page:  0252  MD notified (2nd page):  Time of second page:  Responding MD:  MD Gerlean Ren   Time MD responded: [id number]

## 2014-09-04 NOTE — Procedures (Signed)
Extubation Procedure Note  Patient Details:   Name: Beth Fitzgerald DOB: 1975-09-27 MRN: [id number]   Airway Documentation:     Evaluation  O2 sats: stable throughout Complications: No apparent complications Patient did tolerate procedure well. Bilateral Breath Sounds: Clear Suctioning: Oral Yes Patient tolerated wean. MD ordered to extubate. Positive for cuff leak. Patient extubated to a 3 Lpm nasal cannula. No signs of dyspnea or stridor noted. RN at bedside. Patient resting comfortably.   [name] 09/04/2014, 9:18 AM

## 2014-09-05 LAB — BASIC METABOLIC PANEL
ANION GAP: 8 (ref 5–15)
Anion gap: 6 (ref 5–15)
BUN: 18 mg/dL (ref 6–20)
BUN: 21 mg/dL — AB (ref 6–20)
CALCIUM: 6.7 mg/dL — AB (ref 8.9–10.3)
CHLORIDE: 119 mmol/L — AB (ref 101–111)
CHLORIDE: 113 mmol/L — AB (ref 101–111)
CO2: 21 mmol/L — ABNORMAL LOW (ref 22–32)
CO2: 22 mmol/L (ref 22–32)
Calcium: 6.6 mg/dL — ABNORMAL LOW (ref 8.9–10.3)
Creatinine, Ser: 1.12 mg/dL — ABNORMAL HIGH (ref 0.44–1.00)
Creatinine, Ser: 1.05 mg/dL — ABNORMAL HIGH (ref 0.44–1.00)
GFR calc Af Amer: >60 mL/min (ref >60)
Glucose, Bld: 148 mg/dL — ABNORMAL HIGH (ref 65–99)
Glucose, Bld: 113 mg/dL — ABNORMAL HIGH (ref 65–99)
POTASSIUM: 3.2 mmol/L — AB (ref 3.5–5.1)
Potassium: 3.4 mmol/L — ABNORMAL LOW (ref 3.5–5.1)
SODIUM: 141 mmol/L (ref 135–145)
Sodium: 148 mmol/L — ABNORMAL HIGH (ref 135–145)

## 2014-09-05 LAB — GLUCOSE, CAPILLARY
GLUCOSE-CAPILLARY: 120 mg/dL — AB (ref 65–99)
Glucose-Capillary: 118 mg/dL — ABNORMAL HIGH (ref 65–99)
Glucose-Capillary: 135 mg/dL — ABNORMAL HIGH (ref 65–99)
Glucose-Capillary: 94 mg/dL (ref 65–99)

## 2014-09-05 LAB — CBC
HCT: 26.5 % — ABNORMAL LOW (ref 36.0–46.0)
HEMOGLOBIN: 8.2 g/dL — AB (ref 12.0–15.0)
MCH: 22.2 pg — AB (ref 26.0–34.0)
MCHC: 30.9 g/dL (ref 30.0–36.0)
MCV: 71.6 fL — ABNORMAL LOW (ref 78.0–100.0)
Platelets: 202 10*3/uL (ref 150–400)
RBC: 3.7 MIL/uL — ABNORMAL LOW (ref 3.87–5.11)
RDW: 18.7 % — ABNORMAL HIGH (ref 11.5–15.5)
WBC: 20.3 10*3/uL — ABNORMAL HIGH (ref 4.0–10.5)

## 2014-09-05 LAB — T4, FREE: FREE T4: 1.02 ng/dL (ref 0.61–1.12)

## 2014-09-05 MED ORDER — NORGESTIM-ETH ESTRAD TRIPHASIC 0.18/0.215/0.25 MG-35 MCG PO TABS: 1.0000 | ORAL_TABLET | Freq: Every day | ORAL | Status: DC

## 2014-09-05 MED ORDER — POTASSIUM CHLORIDE 10 MEQ/100ML IV SOLN
10.0000 meq | INTRAVENOUS | Status: AC
  Administered 2014-09-05 – 2014-09-06 (×4): 10 meq via INTRAVENOUS
  Filled 2014-09-05 (×4): qty 100

## 2014-09-05 MED ORDER — DESMOPRESSIN ACE SPRAY REFRIG 0.01 % NA SOLN
1.0000 | Freq: Two times a day (BID) | NASAL | Status: DC
  Administered 2014-09-05 – 2014-09-10 (×11): 1 via NASAL
  Filled 2014-09-05 (×3): qty 5

## 2014-09-05 MED ORDER — CALCIUM CARBONATE-VITAMIN D 500-200 MG-UNIT PO TABS
1.0000 | ORAL_TABLET | Freq: Every day | ORAL | Status: DC
  Administered 2014-09-05 – 2014-09-10 (×6): 1 via ORAL
  Filled 2014-09-05 (×10): qty 1

## 2014-09-05 MED ORDER — ASPIRIN EC 81 MG PO TBEC
81.0000 mg | DELAYED_RELEASE_TABLET | Freq: Every day | ORAL | Status: DC
  Administered 2014-09-06 – 2014-09-10 (×5): 81 mg via ORAL
  Filled 2014-09-05 (×6): qty 1

## 2014-09-05 MED ORDER — POTASSIUM CHLORIDE 2 MEQ/ML IV SOLN
INTRAVENOUS | Status: DC
  Administered 2014-09-05: 14:00:00 via INTRAVENOUS
  Filled 2014-09-05 (×4): qty 1000

## 2014-09-05 MED ORDER — LEVOTHYROXINE SODIUM 100 MCG PO TABS
100.0000 ug | ORAL_TABLET | Freq: Every day | ORAL | Status: DC
  Administered 2014-09-06 – 2014-09-10 (×5): 100 ug via ORAL
  Filled 2014-09-05 (×8): qty 1

## 2014-09-05 MED ORDER — CALCIUM CARB-CHOLECALCIFEROL 600-800 MG-UNIT PO TABS: 1.0000 | ORAL_TABLET | Freq: Every day | ORAL | Status: DC

## 2014-09-05 MED ORDER — CETYLPYRIDINIUM CHLORIDE 0.05 % MT LIQD
7.0000 mL | Freq: Two times a day (BID) | OROMUCOSAL | Status: DC
  Administered 2014-09-05 – 2014-09-09 (×9): 7 mL via OROMUCOSAL

## 2014-09-05 MED ORDER — HYDROCORTISONE 10 MG PO TABS
10.0000 mg | ORAL_TABLET | Freq: Every day | ORAL | Status: DC
  Filled 2014-09-05: qty 1

## 2014-09-05 NOTE — Progress Notes (Signed)
Notified MD Deterding in regards to pt's urine output approx 20ccs/hr and is receiving D5 with 20 meq of K at 150cc/hr. MD Deterding said to continue to monitor and assess, will check potassium with AM labs. Will continue to monitor and assess.

## 2014-09-05 NOTE — Progress Notes (Signed)
eLink Physician-Brief Progress Note Patient Name: Beth Fitzgerald DOB: [birth year] MRN: [id number]   Date of Service  09/05/2014  HPI/Events of Note  RN called family (son and sister), concerned about level of care and monitoring in the stepdown unit  eICU Interventions  Dr. Alva Garnet has spoken with family at length about step down and the level of care.  Patient does not meet ICU criteria at this time, and is improving clinically.  Major concern for family at the time is not the constant monitoring and labs checks  (which she does not require at this time).  Discussed with RN labs, vitals, and clinical appearance; patient is stable and clinically doing well per RN.  Will check BMP and follow up, otherwise continue current step down care.     Intervention Category Intermediate Interventions: Electrolyte abnormality - evaluation and management  Flor Houdeshell 09/05/2014, 4:19 PM

## 2014-09-05 NOTE — Progress Notes (Signed)
Notified Resident MD Hansel Feinstein in regards to pt's urine decreasing, also more darker in color. Pt is putting out 20-25 ccs an hour. Pt  is receiving DDVAP twice a day, 41mcgs total. MD Hansel Feinstein said to monitor for now. Will continue to monitor and assess.

## 2014-09-05 NOTE — Progress Notes (Signed)
Pt family c/o redness and slight swelling to right cheek. Pt denies any discomfort or pain at this site. Cool compress placed to right side of her face. Will continue to monitor.

## 2014-09-05 NOTE — Progress Notes (Signed)
[facility] Progress Note Patient Name: Beth Fitzgerald Overall DOB: December 05, 1975 MRN: [id number]   Date of Service  09/05/2014  HPI/Events of Note  Na=1421, K=3.2, complaints of Nausea  eICU Interventions  Stop D5NS KCL 40 meq IV  May give zofran PRN     Intervention Category Intermediate Interventions: Electrolyte abnormality - evaluation and management  Damiano Stamper 09/05/2014, 6:17 PM

## 2014-09-05 NOTE — Progress Notes (Signed)
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 Admited via ED 7/06 to ICU/PCCM service with one day history of N/V/D, lactic acidosis, fever. Concern for occult shock.  STUDIES:  CXR 7/6: low lung volumes CXR 7/7: bilateral patchy infiltrates EEG 7/7: mild encephalopathy CT Head 7/7: small high density focus in left frontal lobe consistent with prior surgery, moderate cerebral atrophy KUB 7/7: NG tube in place CXR 7/8: bibasilar atelectasis  Infectious Disease: Urine 7/06 >> No Growth C diff 7/06 >> negative Blood 7/06 >> NGTD  SIGNIFICANT EVENTS: 7/6: Admitted to ICU 7/7: Acute change in mental status. Intubated. EEG with encephalopathy. CT with no acute changes. NG tube placed. CMP with hypernatremia, hypokalemia, hyperchloremia. 7/8: Significant improvement in mental status. Extubated.  SUBJECTIVE:  Decreased UOP with desmopressin  VITAL SIGNS: Temp:  [97.6 F (36.4 C)-98.4 F (36.9 C)] 98.4 F (36.9 C) (07/09 0804) Pulse Rate:  [77-112] 92 (07/09 0800) Resp:  [21-38] 29 (07/09 0800) BP: (118-167)/(68-118) 167/88 mmHg (07/09 0800) SpO2:  [94 %-100 %] 97 % (07/09 0800) Weight:  [171 lb 4.8 oz (77.7 kg)] 171 lb 4.8 oz (77.7 kg) (07/09 0500) HEMODYNAMICS:   VENTILATOR SETTINGS:   INTAKE / OUTPUT:  Intake/Output Summary (Last 24 hours) at 09/05/14 0811 Last data filed at 09/05/14 6468  Gross per 24 hour  Intake 3127.5 ml  Output    700 ml  Net 2427.5 ml    PHYSICAL EXAMINATION: General: NAD Neuro:  Follows commands, no focal deficits, improved alertness HEENT:  PERRLA, dry mucous membranes. NG tube in  place Cardiovascular:  RRR Lungs:  CTAB Abdomen:  Soft and nondistended. + BS Skin:  No rashes noted.  LABS:  CBC  Recent Labs Lab 09/03/14 1509 09/04/14 0430 09/05/14 0430  WBC 17.0* 17.2* 20.3*  HGB 11.7* 9.5* 8.2*  HCT 37.7 30.8* 26.5*  PLT 337 235 202   Coag's No results for input(s): APTT, INR in the last 168 hours. BMET  Recent Labs Lab 09/04/14 1315 09/04/14 1756 09/05/14 0430  NA 157* 152* 148*  K 3.6 3.5 3.4*  CL 128* 125* 119*  CO2 23 22 21*  BUN 12 14 18   CREATININE 1.25* 1.29* 1.12*  GLUCOSE 150* 132* 148*   Electrolytes  Recent Labs Lab 09/04/14 1315 09/04/14 1756 09/05/14 0430  CALCIUM 7.1* 6.9* 6.7*   Sepsis Markers  Recent Labs Lab 09/02/14 1138 09/02/14 1457 09/02/14 1820 09/03/14 1017 09/04/14 0430  LATICACIDVEN 3.61* 4.09*  --  3.3*  --   PROCALCITON  --   --  1.57  --  0.98   ABG  Recent Labs Lab 09/03/14 0153 09/03/14 0619 09/03/14 1204  PHART 7.347* 7.365 7.240*  PCO2ART 17.6* 31.7* 62.9*  PO2ART 75.0* 65.0* 75.0*   Liver Enzymes  Recent Labs Lab 09/02/14 1100 09/03/14 1509 09/04/14 0430  AST 28 29 26   ALT 17 19 18   ALKPHOS 71 53 51  BILITOT 0.6 0.5 0.3  ALBUMIN 3.1* 2.3* 2.0*   Cardiac Enzymes No results for input(s): TROPONINI, PROBNP in the last 168 hours. Glucose  Recent Labs Lab 09/03/14 2341 09/04/14 0437 09/04/14 0758 09/04/14 1150 09/04/14 1551 09/04/14 2200  GLUCAP 224* 224* 187*  157* 116* 132*   Imaging No results found.  ASSESSMENT / PLAN:  PULMONARY OETT 09/03/14 A: ALI vs. Pulmonary Edema  P:   Intubated 7/7. Extubated 7/8 Steroids  CARDIOVASCULAR CVL: PICC 7/6 A:  Initially Hypotensive--Resolved Sinus Tachycardia Possible Septic Shock  P:  Monitor HR and BP Lovenox  RENAL A:   Diabetes Insipidus AKI (Creatinine 1.12)--Improving Anion Gap Metabolic Acidosis, Elevated Lactate (3.3) Severe Hypernatremia (162)--Improving Hyperchloremia -- improving   P:    Nasal Desmopressin  Follow CMP  D5W with 40 KCl @ 75 Calculate free water with each CMP and adjust fluids accordingly. Currently 2.2 L  Correct electrolytes as indicated I/Os  GASTROINTESTINAL A:   Gastroenteritis  P:   S/p NG tube TF discontinued Reg diet   HEMATOLOGIC A:  Leukocytosis (WBC 17.2) Anemia (9.5)  P:  Follow CBC SCD.  Lovenox  INFECTIOUS A:   Possible Sepsis although viral etiology more likely with Gastro. qSOFA 3. Lactate 4.09 Gastroenteritis  P:   Vanc 7/06 >> 7/8 Metronidazole 7/06 >>  7/8 Ciproflixacin 7/06 >> 7/8  Abx d/c 2/2 low likelihood of bacterial infx   ENDOCRINE A:   Hyperglycemia, likely secondary to steroids H/O Panhypopituirarism Hyperthyroidism (TSH 0.114)  P:   Monitor CBGs, Novolog per parameters Levothyroxine.   NEUROLOGIC A:   Acute Encephalopathy due to Hypernatremia- resolved Hearing Loss at Baseline   P:   Continue to monitor for improvement RASS Goal 0  FAMILY  - Updates: Mother updated 7/8  - Inter-disciplinary family meet or Palliative Care meeting due by: 09/08/14.   Alyssa A. Lincoln Brigham MD, [facility] Resident PGY-1 Pager [phone]   PCCM ATTENDING: I have seen pt in conjunction with Dr Lincoln Brigham and reviewed hospital database in detail.  The above assessment and plan was formulated under my direction.   It appears that she is approaching her baseline neurologically There is no evidence of ongoing sepsis and the original dx was likely a viral gastroenteritis She is now off all abx  The elevated WBC count is likely steroid induced Her electrolytes are improving and D5W has been decreased Stress dose steroids are stopped and she is back on her baseline dose of HC 10 mg daily Diet and activity are to be advanced Family is concerned about ongoing need for low flow O2 - this is likely due to mild volume overload incurred during volume resuscitation for initial presentation of severe sepsis with  persistent elevation of lactic acid Transfer to med-surg floor Recheck CBC, BMET and CXR 7/10 It is likely that she will be ready for discharge to home 7/11. Therefore, kept on PCCM service  Merton Border, MD;  PCCM service; Mobile [phone]

## 2014-09-06 ENCOUNTER — Inpatient Hospital Stay (HOSPITAL_COMMUNITY): Payer: Medicare Other

## 2014-09-06 LAB — BASIC METABOLIC PANEL
Anion gap: 9 (ref 5–15)
Anion gap: 12 (ref 5–15)
BUN: 14 mg/dL (ref 6–20)
BUN: 13 mg/dL (ref 6–20)
CHLORIDE: 104 mmol/L (ref 101–111)
CO2: 21 mmol/L — ABNORMAL LOW (ref 22–32)
CO2: 22 mmol/L (ref 22–32)
Calcium: 7.2 mg/dL — ABNORMAL LOW (ref 8.9–10.3)
Calcium: 8.1 mg/dL — ABNORMAL LOW (ref 8.9–10.3)
Chloride: 107 mmol/L (ref 101–111)
Creatinine, Ser: 0.97 mg/dL (ref 0.44–1.00)
Creatinine, Ser: 1.1 mg/dL — ABNORMAL HIGH (ref 0.44–1.00)
GFR calc Af Amer: >60 mL/min (ref >60)
GFR calc Af Amer: >60 mL/min (ref >60)
GLUCOSE: 74 mg/dL (ref 65–99)
Glucose, Bld: 141 mg/dL — ABNORMAL HIGH (ref 65–99)
Potassium: 3.3 mmol/L — ABNORMAL LOW (ref 3.5–5.1)
Potassium: 4.1 mmol/L (ref 3.5–5.1)
SODIUM: 137 mmol/L (ref 135–145)
Sodium: 138 mmol/L (ref 135–145)

## 2014-09-06 LAB — BLOOD GAS, ARTERIAL
Acid-base deficit: 1.2 mmol/L (ref 0.0–2.0)
BICARBONATE: 21.4 meq/L (ref 20.0–24.0)
DRAWN BY: 235881
O2 Content: 3 L/min
O2 Saturation: 93.3 %
PATIENT TEMPERATURE: 98.6
PH ART: 7.523 — AB (ref 7.350–7.450)
TCO2: 22.2 mmol/L (ref 0–100)
pCO2 arterial: 26.1 mmHg — ABNORMAL LOW (ref 35.0–45.0)
pO2, Arterial: 57.2 mmHg — ABNORMAL LOW (ref 80.0–100.0)

## 2014-09-06 LAB — CBC
HEMATOCRIT: 31.3 % — AB (ref 36.0–46.0)
Hemoglobin: 10 g/dL — ABNORMAL LOW (ref 12.0–15.0)
MCH: 22.6 pg — ABNORMAL LOW (ref 26.0–34.0)
MCHC: 31.9 g/dL (ref 30.0–36.0)
MCV: 70.7 fL — ABNORMAL LOW (ref 78.0–100.0)
Platelets: 214 10*3/uL (ref 150–400)
RBC: 4.43 MIL/uL (ref 3.87–5.11)
RDW: 17.8 % — ABNORMAL HIGH (ref 11.5–15.5)
WBC: 15.9 10*3/uL — ABNORMAL HIGH (ref 4.0–10.5)

## 2014-09-06 LAB — STOOL CULTURE

## 2014-09-06 LAB — LACTIC ACID, PLASMA: Lactic Acid, Venous: 1.4 mmol/L (ref 0.5–2.0)

## 2014-09-06 LAB — GLUCOSE, CAPILLARY
GLUCOSE-CAPILLARY: 72 mg/dL (ref 65–99)
GLUCOSE-CAPILLARY: 160 mg/dL — AB (ref 65–99)

## 2014-09-06 LAB — BRAIN NATRIURETIC PEPTIDE: B Natriuretic Peptide: 312.7 pg/mL — ABNORMAL HIGH (ref 0.0–100.0)

## 2014-09-06 LAB — TROPONIN I: TROPONIN I: 0.13 ng/mL — AB (ref <0.031)

## 2014-09-06 MED ORDER — POTASSIUM CHLORIDE 10 MEQ/50ML IV SOLN
10.0000 meq | INTRAVENOUS | Status: AC
  Administered 2014-09-06 (×4): 10 meq via INTRAVENOUS
  Filled 2014-09-06 (×6): qty 50

## 2014-09-06 MED ORDER — FUROSEMIDE 10 MG/ML IJ SOLN
40.0000 mg | Freq: Once | INTRAMUSCULAR | Status: AC
  Administered 2014-09-06: 40 mg via INTRAVENOUS
  Filled 2014-09-06: qty 4

## 2014-09-06 MED ORDER — SODIUM CHLORIDE 0.9 % IV SOLN: INTRAVENOUS | Status: DC

## 2014-09-06 MED ORDER — POTASSIUM CHLORIDE CRYS ER 20 MEQ PO TBCR
40.0000 meq | EXTENDED_RELEASE_TABLET | Freq: Two times a day (BID) | ORAL | Status: AC
  Administered 2014-09-06 (×2): 40 meq via ORAL
  Filled 2014-09-06 (×2): qty 2

## 2014-09-06 MED ORDER — HYDROCORTISONE NA SUCCINATE PF 100 MG IJ SOLR
100.0000 mg | Freq: Four times a day (QID) | INTRAMUSCULAR | Status: DC
  Administered 2014-09-06: 100 mg via INTRAVENOUS
  Filled 2014-09-06: qty 2

## 2014-09-06 MED ORDER — HYDROCORTISONE 10 MG PO TABS
10.0000 mg | ORAL_TABLET | Freq: Every day | ORAL | Status: DC
  Administered 2014-09-07 – 2014-09-08 (×2): 10 mg via ORAL
  Filled 2014-09-06 (×2): qty 1

## 2014-09-06 NOTE — Progress Notes (Signed)
09/06/2014 2:24 PM  This is a late entry.  During shift report this am myself and the outgoing nurse were approached by three of the patient's family members who appeared very upset.  One of the sisters expressed concern with her potassium level this am (@ 3.3) and wondered why she wasn't getting more IV potassium.  The mother also expressed concerns with her breathing, which upon assessment, did appear slightly labored.  Pt also appeared diaphoretic, denied pain when asked.  Dr. Joya Gaskins was notified immediately via phone to update him on patient condition as well as the family's request to speak/see a doctor immediately.  Dr. Joya Gaskins arrived shortly thereafter and after assessing the patient and speaking with the family, wrote orders to transfer back to ICU.  ABG was drawn, and IV lasix and solu-cortef were administered per MD order while a bed was assigned for the patient.  Once a bed was assigned, I called report to the [facility] nurse receiving the patient and the charge nurse and NT escorted the patient and family to [facility].  Meds and belongings sent with patient. Princella Pellegrini

## 2014-09-06 NOTE — Progress Notes (Signed)
Pt was observed to have redness and swelling on the right cheek during shift change, family is aware. This time RN noticed that pt has more facial swelling with bilateral wheezing. Vital signs taken, T- 98.7, P-108, R-22, BP 152/94, O2 sat 96% at 3Lpm nasal cannula. On call MD Joya Gaskins paged, notified and said to continue to monitor. No new orders made. Will continue to monitor pt closely.

## 2014-09-06 NOTE — Progress Notes (Signed)
Family is concerned patient is not receiving the appropriate amount of hydrocortisone at this time. Although the patient takes 10mg  daily at home, they feel at this time her body is under a great amount of stress due to illness and therefore needs extra steroid supplement. Elink MD Deterding notified of family's request to review meds.

## 2014-09-06 NOTE — Progress Notes (Signed)
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 Admited via ED 7/06 to ICU/PCCM service with one day history of N/V/D, lactic acidosis, fever. Concern for occult shock.  STUDIES:  CXR 7/6: low lung volumes CXR 7/7: bilateral patchy infiltrates EEG 7/7: mild encephalopathy CT Head 7/7: small high density focus in left frontal lobe consistent with prior surgery, moderate cerebral atrophy KUB 7/7: NG tube in place CXR 7/8: bibasilar atelectasis CXR 7/10: bilat airspace dz  Infectious Disease: Urine 7/06 >> No Growth C diff 7/06 >> negative Blood 7/06 >> NGTD  SIGNIFICANT EVENTS: 7/6: Admitted to ICU 7/7: Acute change in mental status. Intubated. EEG with encephalopathy. CT with no acute changes. NG tube placed. CMP with hypernatremia, hypokalemia, hyperchloremia. 7/8: Significant improvement in mental status. Extubated. 7/9: Transferred to floor 7/10 more wheezing, edema and resp distress. Transferred back to ICU.  Lines/Drains/Airway: Urethral Cathter 7/6 PICC Right Basilic 7/6 Peripheral IV Left Wrist 7/6  SUBJECTIVE:  No acute events overnight.  VITAL SIGNS: Temp:  [97.3 F (36.3 C)-99.6 F (37.6 C)] 98.6 F (37 C) (07/11 0352) Pulse Rate:  [93-123] 93 (07/11 0600) Resp:  [20-43] 20 (07/11 0600) BP: (87-155)/(70-104) 123/92 mmHg (07/11 0600) SpO2:  [91 %-98 %] 96 % (07/11 0600) HEMODYNAMICS:   VENTILATOR SETTINGS:   INTAKE / OUTPUT:  Intake/Output Summary (Last 24 hours) at 09/07/14 0714 Last data filed at 09/07/14 0600  Gross per 24 hour  Intake    510 ml  Output   5925 ml  Net  -5415 ml    PHYSICAL  EXAMINATION: General: 39yo female resting comfortably in no apparent distress.  Neuro:  Follows commands, no focal deficits, alert HEENT:  PERRLA. NG tube in place. Cardiovascular:  S1 and S2 noted. No murmurs. Regular rate and rhythm. Lungs:  Clear and equal bilaterally. No wheezes. No increased work of breathing. Abdomen:  Soft and nondistended. Bowel sounds noted. Nontender. Musculoskeletal:  Improved edema Skin:  No rashes noted.  LABS:  CBC  Recent Labs Lab 09/04/14 0430 09/05/14 0430 09/06/14 0500  WBC 17.2* 20.3* 15.9*  HGB 9.5* 8.2* 10.0*  HCT 30.8* 26.5* 31.3*  PLT 235 202 214   Coag's No results for input(s): APTT, INR in the last 168 hours. BMET  Recent Labs Lab 09/06/14 0500 09/06/14 1746 09/07/14 0229  NA 137 138 137  K 3.3* 4.1 3.8  CL 107 104 106  CO2 21* 22 20*  BUN 14 13 18   CREATININE 0.97 1.10* 1.08*  GLUCOSE 74 141* 83   Electrolytes  Recent Labs Lab 09/06/14 0500 09/06/14 1746 09/07/14 0229  CALCIUM 7.2* 8.1* 7.9*   Sepsis Markers  Recent Labs Lab 09/02/14 1457 09/02/14 1820 09/03/14 1017 09/04/14 0430 09/06/14 1039  LATICACIDVEN 4.09*  --  3.3*  --  1.4  PROCALCITON  --  1.57  --  0.98  --    ABG  Recent Labs Lab 09/03/14 0619 09/03/14 1204 09/06/14 0905  PHART 7.365 7.240* 7.523*  PCO2ART 31.7* 62.9* 26.1*  PO2ART 65.0* 75.0* 57.2*   Liver Enzymes  Recent Labs Lab 09/02/14 1100 09/03/14 1509 09/04/14 0430  AST 28 29 26   ALT 17 19 18   ALKPHOS  71 53 51  BILITOT 0.6 0.5 0.3  ALBUMIN 3.1* 2.3* 2.0*   Cardiac Enzymes  Recent Labs Lab 09/06/14 1000  TROPONINI 0.13*   Glucose  Recent Labs Lab 09/05/14 0801 09/05/14 1120 09/05/14 1631 09/05/14 2202 09/06/14 0723 09/06/14 1619  GLUCAP 118* 135* 120* 94 72 160*   Imaging Dg Chest Port 1 View  09/06/2014   CLINICAL DATA:  Respiratory failure.  EXAM: PORTABLE CHEST - 1 VIEW  COMPARISON:  09/04/2014 and prior studies  FINDINGS: Cardiomediastinal  silhouette is unchanged.  Bilateral airspace opacities are slightly increased.  Left lower lung consolidation/atelectasis again noted.  A right PICC line is present with tip overlying the lower SVC.  An endotracheal tube and small bore feeding tube have been removed.  There is no evidence of pneumothorax.  IMPRESSION: Increasing bilateral airspace opacities/ edema. Status post endotracheal tube and small bore feeding tube removal.  Unchanged left lower lung consolidation/atelectasis.  No other significant changes.   Electronically Signed   By: Margarette Canada M.D.   On: 09/06/2014 09:56    CXR 7/10>>increased pulm edema   ASSESSMENT / PLAN:  PULMONARY OETT 09/03/14>>7/8 A: ALI vs. Pulmonary Edema persists Tachypneic Oxygen Requirement, 4L [place]  P:   Intubated 7/7. Extubated 7/8 Hydorcortisone 10mg  in am 5mg  in pm. Wean off of O2 when able  CARDIOVASCULAR CVL: PICC 7/6 A:  Initially Hypotensive--Resolved Sinus Tachycardia Possible Septic Shock Slightly elevated troponins (0.114) Echocardiogram today  P:  Monitor HR and BP Will not obtain further troponins at this time. Suspect demand ischemia. Furosemide 40mg  given on 7/10. Will give an additional 40mg  today. Lovenox  RENAL A:   Diabetes Insipidus AKI (Creatinine 1.08)--Improving Anion Gap Metabolic Acidosis, Elevated Lactate (3.3>1.4)--Resolved Respiratory Alkalosis Severe Hypernatremia - Resolved (137) Hyperchloremia- Resolved (106) Hypokalemia- Resolved (3.8)   P:   Cont Desmopressin nasal   Replete electrolytes when indicated. 40mg  K PO today due to lasix. Continue to follow BMP. Will check at 2pm to monitor K.  GASTROINTESTINAL A:   Gastroenteritis>>seems improved  P:   Heart Healthy/Carb Modified Diet Imodium Protonix  HEMATOLOGIC A:  Leukocytosis (15.9) Anemia (10)  P:  Follow CBC. Next at 2pm. SCD. Lovenox  INFECTIOUS A:   Gastroenteritis, ABX stopped 7/8  P:   Vanc 7/06 >> 7/8 Metronidazole  7/06 >>  7/8 Ciproflixacin 7/06 >> 7/8  ENDOCRINE A:   Hyperglycemia, likely secondary to steroids H/O Panhypopituirarism Hyperthyroidism (TSH 0.114) Family wants inpatient endocrine consult and call to [facility] endocrine MD  P:   Monitor CBGs--monitor with meals q24hr and then transition to per floor protocol Levothyroxine.  Desmopressin, Steroids  NEUROLOGIC A:   Acute Encephalopathy due to Hypernatremia--Resolved Hearing Loss at Baseline  P:   Continue to monitor for improvement Ensure hearing aids are in place to prevent delirium RASS Goal 0  FAMILY  - Updates: Mother and sisters at bedside. - Inter-disciplinary family meet or Palliative Care meeting due by: 09/08/14.  TODAY'S SUMMARY:  Continue to diurese and monitor respiratory status. Currently on 4L [place], but wean as able. Follow up Echocardiogram. Imodium for diarrhea, C. Diff negative.  Dr. Junie Panning [facility] PGY-2 09/07/2014, 7:14 AM

## 2014-09-06 NOTE — Significant Event (Signed)
Pt appears much better after Lasix - brisk diuresis noted I have changed hydrocortisone back to her baseline dose and further supplemented her K+ F/U chem panel later today and in AM Trop I is minimally elevated and does not warrant further eval @ this time Family updated  Merton Border, MD ; [facility] service Mobile [phone].  After 5:30 PM or weekends, call [phone]

## 2014-09-06 NOTE — Progress Notes (Signed)
PT Cancellation Note  Patient Details Name: Beth Fitzgerald MRN: [id number] DOB: 11-14-1975   Cancelled Treatment:    Reason Eval/Treat Not Completed: Medical issues which prohibited therapy. Pt transferred back to ICU this morning. Will hold PT eval until updated MD note is available. Will check back as schedule allows.    Rolinda Roan 09/06/2014, 12:57 PM   Rolinda Roan, PT, DPT Acute Rehabilitation Services Pager: [phone]

## 2014-09-06 NOTE — Progress Notes (Signed)
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 Admited via ED 7/06 to ICU/PCCM service with one day history of N/V/D, lactic acidosis, fever. Concern for occult shock.  STUDIES:  CXR 7/6: low lung volumes CXR 7/7: bilateral patchy infiltrates EEG 7/7: mild encephalopathy CT Head 7/7: small high density focus in left frontal lobe consistent with prior surgery, moderate cerebral atrophy KUB 7/7: NG tube in place CXR 7/8: bibasilar atelectasis 7/10 CXR: bilat airspace dz  Infectious Disease: Urine 7/06 >> No Growth C diff 7/06 >> negative Blood 7/06 >> NGTD  SIGNIFICANT EVENTS: 7/6: Admitted to ICU 7/7: Acute change in mental status. Intubated. EEG with encephalopathy. CT with no acute changes. NG tube placed. CMP with hypernatremia, hypokalemia, hyperchloremia. 7/8: Significant improvement in mental status. Extubated. 7/10 more wheezing, edema and resp distress   SUBJECTIVE:  Markedly worse , more edema and wheezing  VITAL SIGNS: Temp:  [98 F (36.7 C)-98.7 F (37.1 C)] 98.7 F (37.1 C) (07/10 0519) Pulse Rate:  [92-108] 102 (07/10 0519) Resp:  [20-37] 20 (07/10 0519) BP: (146-184)/(76-100) 158/92 mmHg (07/10 0519) SpO2:  [92 %-97 %] 92 % (07/10 0519) HEMODYNAMICS:   VENTILATOR SETTINGS:   INTAKE / OUTPUT:  Intake/Output Summary (Last 24 hours) at 09/06/14 0750 Last data filed at 09/06/14 0600  Gross per 24 hour  Intake 1147.5 ml  Output   1250 ml  Net -102.5 ml    PHYSICAL EXAMINATION: General: 39yo female alert and audible wheeze and increased WOB Neuro:  Follows commands, no focal deficits, improved  alertness HEENT:  PERRLA, dry mucous membranes. NG tube in place Cardiovascular:  S1 and S2 noted. No murmurs. Tachycardic. Lungs:  Increased WOB, exp wheezes Abdomen:  Soft and nondistended. Bowel sounds noted.  Musculoskeletal:  ++ edema noted Skin:  No rashes noted.  LABS:  CBC  Recent Labs Lab 09/04/14 0430 09/05/14 0430 09/06/14 0500  WBC 17.2* 20.3* 15.9*  HGB 9.5* 8.2* 10.0*  HCT 30.8* 26.5* 31.3*  PLT 235 202 214   Coag's No results for input(s): APTT, INR in the last 168 hours. BMET  Recent Labs Lab 09/05/14 0430 09/05/14 1705 09/06/14 0500  NA 148* 141 137  K 3.4* 3.2* 3.3*  CL 119* 113* 107  CO2 21* 22 21*  BUN 18 21* 14  CREATININE 1.12* 1.05* 0.97  GLUCOSE 148* 113* 74   Electrolytes  Recent Labs Lab 09/05/14 0430 09/05/14 1705 09/06/14 0500  CALCIUM 6.7* 6.6* 7.2*   Sepsis Markers  Recent Labs Lab 09/02/14 1138 09/02/14 1457 09/02/14 1820 09/03/14 1017 09/04/14 0430  LATICACIDVEN 3.61* 4.09*  --  3.3*  --   PROCALCITON  --   --  1.57  --  0.98   ABG  Recent Labs Lab 09/03/14 0153 09/03/14 0619 09/03/14 1204  PHART 7.347* 7.365 7.240*  PCO2ART 17.6* 31.7* 62.9*  PO2ART 75.0* 65.0* 75.0*   Liver Enzymes  Recent Labs Lab 09/02/14 1100 09/03/14 1509 09/04/14 0430  AST 28 29 26   ALT 17 19 18   ALKPHOS 71 53 51  BILITOT 0.6 0.5 0.3  ALBUMIN 3.1* 2.3* 2.0*   Cardiac Enzymes No results for input(s): TROPONINI, PROBNP in the last 168  hours. Glucose  Recent Labs Lab 09/04/14 2200 09/05/14 0801 09/05/14 1120 09/05/14 1631 09/05/14 2202 09/06/14 0723  GLUCAP 132* 118* 135* 120* 94 72   Imaging No results found.  CXR 7/10>>increased pulm edema   ASSESSMENT / PLAN:  PULMONARY OETT 09/03/14>>7/8 A: ALI vs. Pulmonary Edema persists  P:   Intubated 7/7. Extubated 7/8 tfr back to ICU Give lasix Give IV steroids   CARDIOVASCULAR CVL: PICC 7/6 A:  Initially Hypotensive--Resolved Sinus Tachycardia Possible  Septic Shock Also poss AMI or CHF   P:  Echo, enzymes, ecg lasix Lovenox  RENAL A:   Diabetes Insipidus AKI (Creatinine 1.35)--Improving Anion Gap Metabolic Acidosis, Elevated Lactate (3.3)>>need to rechec k Severe Hypernatremia -Improving Hyperchloremia >>improved  Hypokalemia  P:   Cont Desmopressin nasal   Give KCL IV  GASTROINTESTINAL A:   Gastroenteritis>>seems improved  P:   Hold diet   HEMATOLOGIC A:  Leukocytosis (WBC 17.2)>>15K Anemia (9.5)>>10  P:  Follow CBC SCD. Lovenox  INFECTIOUS A:   Gastroenteritis, ABX stopped 7/8  P:   Vanc 7/06 >> 7/8 Metronidazole 7/06 >>  7/8 Ciproflixacin 7/06 >> 7/8  ENDOCRINE A:   Hyperglycemia, likely secondary to steroids H/O Panhypopituirarism Hyperthyroidism (TSH 0.114) Family wants inpatient endocrine consult and call to [facility] endocrine MD  P:   Monitor CBGs Moderate Insulin Sliding Scale Levothyroxine.   NEUROLOGIC A:   Acute Encephalopathy due to Hypernatremia Hearing Loss at Baseline  P:   Continue to monitor for improvement RASS Goal 0  FAMILY  - Updates: Mother and sisters at bedside.  - Inter-disciplinary family meet or Palliative Care meeting due by: 09/08/14.  TODAY'S SUMMARY: Pt worse , will need ICU tfr .   Dr Alva Garnet to see again once in unit    09/06/2014, 7:50 AM

## 2014-09-06 NOTE — Plan of Care (Signed)
Problem: Consults Goal: Respiratory Problems Patient Education See Patient Education Module for education specifics.  Outcome: Progressing Cough and Deep Breathe with teachback.

## 2014-09-07 ENCOUNTER — Ambulatory Visit (HOSPITAL_COMMUNITY): Payer: Medicare Other

## 2014-09-07 ENCOUNTER — Inpatient Hospital Stay (HOSPITAL_COMMUNITY): Payer: Medicare Other

## 2014-09-07 DIAGNOSIS — R0600 Dyspnea, unspecified: Secondary | ICD-10-CM

## 2014-09-07 LAB — BASIC METABOLIC PANEL
Anion gap: 11 (ref 5–15)
Anion gap: 12 (ref 5–15)
BUN: 18 mg/dL (ref 6–20)
BUN: 19 mg/dL (ref 6–20)
CALCIUM: 7.9 mg/dL — AB (ref 8.9–10.3)
CALCIUM: 8.7 mg/dL — AB (ref 8.9–10.3)
CO2: 20 mmol/L — ABNORMAL LOW (ref 22–32)
CO2: 24 mmol/L (ref 22–32)
CREATININE: 1.21 mg/dL — AB (ref 0.44–1.00)
Chloride: 106 mmol/L (ref 101–111)
Chloride: 101 mmol/L (ref 101–111)
Creatinine, Ser: 1.08 mg/dL — ABNORMAL HIGH (ref 0.44–1.00)
GFR calc Af Amer: >60 mL/min (ref >60)
GFR calc Af Amer: >60 mL/min (ref >60)
GFR calc non Af Amer: >60 mL/min (ref >60)
GFR calc non Af Amer: 55 mL/min — ABNORMAL LOW (ref >60)
Glucose, Bld: 83 mg/dL (ref 65–99)
Glucose, Bld: 121 mg/dL — ABNORMAL HIGH (ref 65–99)
Potassium: 3.8 mmol/L (ref 3.5–5.1)
Potassium: 3.3 mmol/L — ABNORMAL LOW (ref 3.5–5.1)
Sodium: 137 mmol/L (ref 135–145)
Sodium: 137 mmol/L (ref 135–145)

## 2014-09-07 LAB — GLUCOSE, CAPILLARY
Glucose-Capillary: 98 mg/dL (ref 65–99)
Glucose-Capillary: 133 mg/dL — ABNORMAL HIGH (ref 65–99)
Glucose-Capillary: 102 mg/dL — ABNORMAL HIGH (ref 65–99)
Glucose-Capillary: 159 mg/dL — ABNORMAL HIGH (ref 65–99)
Glucose-Capillary: 120 mg/dL — ABNORMAL HIGH (ref 65–99)

## 2014-09-07 LAB — CBC
HEMATOCRIT: 34.9 % — AB (ref 36.0–46.0)
Hemoglobin: 11.2 g/dL — ABNORMAL LOW (ref 12.0–15.0)
MCH: 22.2 pg — AB (ref 26.0–34.0)
MCHC: 32.1 g/dL (ref 30.0–36.0)
MCV: 69.2 fL — ABNORMAL LOW (ref 78.0–100.0)
Platelets: 226 10*3/uL (ref 150–400)
RBC: 5.04 MIL/uL (ref 3.87–5.11)
RDW: 17 % — AB (ref 11.5–15.5)
WBC: 16.9 10*3/uL — AB (ref 4.0–10.5)

## 2014-09-07 LAB — CULTURE, BLOOD (ROUTINE X 2)
Culture: NO GROWTH
Culture: NO GROWTH

## 2014-09-07 MED ORDER — GLUCERNA SHAKE PO LIQD
237.0000 mL | Freq: Three times a day (TID) | ORAL | Status: DC
  Administered 2014-09-07 – 2014-09-10 (×4): 237 mL via ORAL

## 2014-09-07 MED ORDER — PANTOPRAZOLE SODIUM 40 MG PO TBEC
40.0000 mg | DELAYED_RELEASE_TABLET | Freq: Every day | ORAL | Status: DC
  Administered 2014-09-07 – 2014-09-09 (×3): 40 mg via ORAL
  Filled 2014-09-07 (×3): qty 1

## 2014-09-07 MED ORDER — HYDROCORTISONE 5 MG PO TABS
5.0000 mg | ORAL_TABLET | Freq: Every day | ORAL | Status: DC
  Administered 2014-09-07: 5 mg via ORAL
  Filled 2014-09-07 (×3): qty 1

## 2014-09-07 MED ORDER — LOPERAMIDE HCL 1 MG/5ML PO LIQD
2.0000 mg | ORAL | Status: DC | PRN
  Filled 2014-09-07: qty 10

## 2014-09-07 MED ORDER — FUROSEMIDE 10 MG/ML IJ SOLN
40.0000 mg | Freq: Every day | INTRAMUSCULAR | Status: DC
  Administered 2014-09-07: 40 mg via INTRAVENOUS
  Filled 2014-09-07: qty 4

## 2014-09-07 MED ORDER — POTASSIUM CHLORIDE CRYS ER 20 MEQ PO TBCR
40.0000 meq | EXTENDED_RELEASE_TABLET | Freq: Once | ORAL | Status: AC
  Administered 2014-09-07: 40 meq via ORAL
  Filled 2014-09-07: qty 2

## 2014-09-07 NOTE — Progress Notes (Signed)
Rehab Admissions Coordinator Note:  Patient was screened by Cleatrice Burke for appropriateness for an Inpatient Acute Rehab Consult per PT recommendation. At this time, we are recommending await further progress with therapy for did well for initial eval. Also recommend OT eval. I iwll follow.Cleatrice Burke 09/07/2014, 1:08 PM  I can be reached at [phone].

## 2014-09-07 NOTE — Progress Notes (Signed)
Nutrition Follow-up  DOCUMENTATION CODES:  Obesity unspecified  INTERVENTION:  Glucerna shake  NUTRITION DIAGNOSIS:  Inadequate oral intake related to dysphagia (decreased appetite) as evidenced by meal completion < 50%, per patient/family report.  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 Admited via ED 7/6 to ICU with one day history of N/V/D, lactic acidosis, fever. Concern for occult shock. Intubated this AM with acute hypoxic respiratory failure, ALI vs edema.  Pt extubated on 09/04/14. Spoke with RN who reports that pt was transferred back to ICU due to volume overload and decreased respiratory status. Pt is currently on IV lasix and clinically improving per MD notes.  Pt working with therapy at time of visit.   Per RN, pt with some dysphagia, but improving post extubation. She reports she has been working extensively with pt to improve swallowing muscles; pt has not received a formal swallow evaluation yet. Swallow function is improving and pt took breakfast well per RN report.   Spoke with family who reports that pt has poor po intake at baseline. Pt is a selective eater and will often eat small portions of favorite foods (ex a small bowl of cereal at breakfast or a half of a hamburger at lunch/dinner). She is currently consuming applesauce and pudding quite well. Per family, pt consumed 1/2 of a pancake at breakfast this morning. Family is amenable to Glucerna supplements to improve nutritional intake.   Height:  Ht Readings from Last 1 Encounters:  09/02/14 5' (1.524 m)    Weight:  Wt  Readings from Last 1 Encounters:  09/05/14 171 lb 4.8 oz (77.7 kg)    Ideal Body Weight:  45.5 kg  Wt Readings from Last 10 Encounters:  09/05/14 171 lb 4.8 oz (77.7 kg)    BMI:  Body mass index is 33.45 kg/(m^2).  Estimated Nutritional Needs:  Kcal:  1650-1850  Protein:  85-95 grams  Fluid:  1.7-1.9 L  Skin:  Reviewed, no issues  Diet Order:  Diet heart healthy/carb modified Room service appropriate?: Yes with Assist; Fluid consistency:: Thin  EDUCATION NEEDS:  Education needs addressed   Intake/Output Summary (Last 24 hours) at 09/07/14 1103 Last data filed at 09/07/14 0945  Gross per 24 hour  Intake    460 ml  Output   2925 ml  Net  -2465 ml    Last BM:  09/06/14  Kermitt Harjo A. Jimmye Norman, RD, LDN, CDE Pager: [phone] After hours Pager: [phone]

## 2014-09-07 NOTE — Progress Notes (Signed)
  Echocardiogram 2D Echocardiogram has been performed.  Beth Fitzgerald 09/07/2014, 12:42 PM

## 2014-09-07 NOTE — Evaluation (Signed)
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 Admited via ED 7/06 to ICU/PCCM service with one day history of N/V/D, lactic acidosis, fever.Concern for occult shock.  Clinical Impression  Pt admitted with above diagnosis. Pt currently with functional limitations due to the deficits listed below (see PT Problem List). At the time of PT eval pt was able to perform transfers and ambulation with assist for balance and support. Pt very motivated to participate with therapy and was independent PTA. Discussed the option of CIR at d/c and pt is very interested in participating if accepted. Pt will benefit from skilled PT to increase their independence and safety with mobility to allow discharge to the venue listed below.       Follow Up Recommendations CIR;Supervision/Assistance - 24 hour    Equipment Recommendations   (TBD - Need to confirm equipment with pt )    Recommendations for Other Services Rehab consult     Precautions / Restrictions Precautions Precautions: Fall Precaution Comments: B hearing aids, however does not hear well out of the L side. Better to be standing on her right Restrictions Weight Bearing Restrictions: No      Mobility  Bed Mobility               General bed mobility comments: Pt sitting up in recliner upon PT arrival however RN states that it was mod assist for transfer to chair.   Transfers Overall transfer level: Needs assistance Equipment used: Rolling walker (2 wheeled) Transfers: Sit to/from Stand Sit to Stand: Mod assist         General transfer comment: Pt was able to power-up to full standing with assist for balance and support. When pt stood without UE support increased  assist was required.   Ambulation/Gait Ambulation/Gait assistance: Min assist Ambulation Distance (Feet): 100 Feet Assistive device: Rolling walker (2 wheeled) Gait Pattern/deviations: Step-through pattern;Decreased stride length;Trunk flexed;Antalgic Gait velocity: Decreased Gait velocity interpretation: Below normal speed for age/gender General Gait Details: +2 helpful for chair follow and equipment. Pt requires hands-on min assist for balance and frequent cueing for improved posture.   Stairs            Wheelchair Mobility    Modified Rankin (Stroke Patients Only)       Balance Overall balance assessment: Needs assistance Sitting-balance support: Feet supported;No upper extremity supported Sitting balance-Leahy Scale: Fair     Standing balance support: No upper extremity supported;During functional activity Standing balance-Leahy Scale: Poor                               Pertinent Vitals/Pain Pain Assessment: No/denies pain    Home Living Family/patient expects to be discharged to:: Inpatient rehab     Type of Home: Apartment Home Access: Stairs to enter   Entrance Stairs-Number of Steps: 3 flights Home Layout: One level Home Equipment: Shower seat      Prior Function Level of Independence: Independent         Comments: Pt has a dog that she was taking out for walks PTA, going up and down 3 flights of steps multiple times a day.      Hand Dominance   Dominant Hand: Right  Extremity/Trunk Assessment   Upper Extremity Assessment: Generalized weakness           Lower Extremity Assessment: Generalized weakness      Cervical / Trunk Assessment: Other exceptions  Communication   Communication: No difficulties  Cognition Arousal/Alertness: Awake/alert Behavior During Therapy: WFL for tasks assessed/performed Overall Cognitive Status: Within Functional Limits for tasks assessed                      General Comments       Exercises        Assessment/Plan    PT Assessment Patient needs continued PT services  PT Diagnosis Difficulty walking;Generalized weakness   PT Problem List Decreased strength;Decreased range of motion;Decreased activity tolerance;Decreased balance;Decreased mobility;Decreased knowledge of use of DME;Decreased safety awareness;Decreased knowledge of precautions;Cardiopulmonary status limiting activity  PT Treatment Interventions DME instruction;Gait training;Stair training;Functional mobility training;Therapeutic activities;Therapeutic exercise;Neuromuscular re-education;Patient/family education   PT Goals (Current goals can be found in the Care Plan section) Acute Rehab PT Goals Patient Stated Goal: Get back to her baseline PT Goal Formulation: With patient/family Time For Goal Achievement: 09/14/14 Potential to Achieve Goals: Good    Frequency Min 3X/week   Barriers to discharge Inaccessible home environment 3 flights of stairs to climb to enter apartment    Co-evaluation               End of Session Equipment Utilized During Treatment: Gait belt;Oxygen Activity Tolerance: Patient tolerated treatment well Patient left: in chair;with call bell/phone within reach;with family/visitor present Nurse Communication: Mobility status         Time: 5956-3875 PT Time Calculation (min) (ACUTE ONLY): 27 min   Charges:   PT Evaluation $Initial PT Evaluation Tier I: 1 Procedure PT Treatments $Gait Training: 8-22 mins   PT G Codes:        Rolinda Roan 2014/09/15, 12:14 PM   Rolinda Roan, PT, DPT Acute Rehabilitation Services Pager: [phone]

## 2014-09-07 NOTE — Progress Notes (Signed)
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 Admited via ED 7/06 to ICU/PCCM service with one day history of N/V/D, lactic acidosis, fever. Concern for occult shock.  STUDIES:  CXR 7/6: low lung volumes CXR 7/7: bilateral patchy infiltrates EEG 7/7: mild encephalopathy CT Head 7/7: small high density focus in left frontal lobe consistent with prior surgery, moderate cerebral atrophy KUB 7/7: NG tube in place CXR 7/8: bibasilar atelectasis CXR 7/10: bilat airspace dz Echo 7/11: severe LVH, EF 03-50%, grade 1 diastolic dysfunction CXR 0/93: improvement of bilateral atelectasis and/or infiltrates  Infectious Disease: Urine 7/06 >> No Growth C diff 7/06 >> negative Blood 7/06 >> NGTD Stool Culture 7/06 >> Negative  SIGNIFICANT EVENTS: 7/6: Admitted to ICU 7/7: Acute change in mental status. Intubated. EEG with encephalopathy. CT with no acute changes. NG tube placed. CMP with hypernatremia, hypokalemia, hyperchloremia. 7/8: Significant improvement in mental status. Extubated. 7/9: Transferred to floor 7/10: more wheezing, edema and resp distress. Transferred back to ICU. 7/11: Significant improvement with lasix, ambulatory, on 4L [place] 7/12: Weaned to 2L [place], ambulatory,   Lines/Drains/Airway: Urethral Cathter 8/1>>8/29 PICC Right Basilic 7/6 Peripheral IV Left Wrist 7/6  SUBJECTIVE:  No acute complaints overnight. Improvement in diarrhea. Ambulating well.   VITAL SIGNS: Temp:  [97.9 F (36.6 C)-99.3 F (37.4 C)] 99.3 F (37.4 C) (07/11 1956) Pulse Rate:  [93-120] 104 (07/11 2100) Resp:  [20-36] 26 (07/11 2100) BP:  (94-155)/(49-113) 116/72 mmHg (07/11 2100) SpO2:  [92 %-97 %] 96 % (07/11 2100) HEMODYNAMICS:   VENTILATOR SETTINGS:   INTAKE / OUTPUT:  Intake/Output Summary (Last 24 hours) at 09/07/14 2115 Last data filed at 09/07/14 2000  Gross per 24 hour  Intake    530 ml  Output   2640 ml  Net  -2110 ml    PHYSICAL EXAMINATION: General: 39yo female resting comfortably in no apparent distress.  Neuro:  Follows commands, no focal deficits, alert HEENT:  PERRLA. MMM. Cardiovascular:  S1 and S2 noted. No murmurs. Regular rate and rhythm. Lungs:  Clear and equal bilaterally. No wheezes. No increased work of breathing. Abdomen:  Soft and nondistended. Bowel sounds noted. Nontender. Musculoskeletal:  Improved edema, normal gait with walker Skin:  No rashes noted.  LABS:  CBC  Recent Labs Lab 09/05/14 0430 09/06/14 0500 09/07/14 1830  WBC 20.3* 15.9* 16.9*  HGB 8.2* 10.0* 11.2*  HCT 26.5* 31.3* 34.9*  PLT 202 214 226   Coag's No results for input(s): APTT, INR in the last 168 hours. BMET  Recent Labs Lab 09/06/14 1746 09/07/14 0229 09/07/14 1830  NA 138 137 137  K 4.1 3.8 3.3*  CL 104 106 101  CO2 22 20* 24  BUN 13 18 19   CREATININE 1.10* 1.08* 1.21*  GLUCOSE 141* 83 121*   Electrolytes  Recent Labs Lab 09/06/14 1746 09/07/14 0229 09/07/14 1830  CALCIUM 8.1* 7.9* 8.7*   Sepsis Markers  Recent Labs Lab 09/02/14 1457 09/02/14 1820 09/03/14 1017 09/04/14 0430 09/06/14 1039  LATICACIDVEN 4.09*  --  3.3*  --  1.4  PROCALCITON  --  1.57  --  0.98  --    ABG  Recent Labs Lab 09/03/14  9163 09/03/14 1204 09/06/14 0905  PHART 7.365 7.240* 7.523*  PCO2ART 31.7* 62.9* 26.1*  PO2ART 65.0* 75.0* 57.2*   Liver Enzymes  Recent Labs Lab 09/02/14 1100 09/03/14 1509 09/04/14 0430  AST 28 29 26   ALT 17 19 18   ALKPHOS 71 53 51  BILITOT 0.6 0.5 0.3  ALBUMIN 3.1* 2.3* 2.0*   Cardiac Enzymes  Recent Labs Lab 09/06/14 1000  TROPONINI 0.13*    Glucose  Recent Labs Lab 09/06/14 0931 09/06/14 1210 09/06/14 1619 09/07/14 0820 09/07/14 1211 09/07/14 1630  GLUCAP 98 133* 160* 102* 159* 120*   Imaging Dg Chest Port 1 View  09/07/2014   CLINICAL DATA:  Respiratory failure.  EXAM: PORTABLE CHEST - 1 VIEW  COMPARISON:  09/06/2014.  FINDINGS: Right PICC line in stable position. Heart size stable. Low lung volumes with bibasilar and bilateral mid lung field atelectasis and/or infiltrates. Small left pleural effusion cannot be excluded. No pneumothorax.  IMPRESSION: 1.  PICC line stable position.  2. Bilateral mid lung field and bibasilar subsegmental atelectasis and/or infiltrates.   Electronically Signed   By: Marcello Moores  Register   On: 09/07/2014 07:18    ASSESSMENT / PLAN:  PULMONARY OETT 09/03/14>>7/8 A: Pulmonary Edema--Improving Tachypneic Oxygen Requirement, 2L [place]  P:   Intubated 7/7. Extubated 7/8 Hydorcortisone 10mg  in am 5mg  in pm. Wean off of O2 when able. Currently on 2L [place].  CARDIOVASCULAR CVL: PICC 7/6 A:  Sinus Tachycardia Slightly elevated troponins (8.466) Grade 1 Diastolic Heart Failure with preserved EF  P:  Monitor HR  Will not obtain further troponins at this time. Suspect demand ischemia. Furosemide 40mg  given on 7/10 and 7/11. Back to baseline weight, will hold today. Lovenox  RENAL A:   Diabetes Insipidus AKI (Creatinine 1.23)--Improving Respiratory Alkalosis Hypokalemia- Resolved (3.3)   P:   Cont Desmopressin nasal   Replete electrolytes when indicated. Kdur given. Continue to follow BMP.   GASTROINTESTINAL A:   Gastroenteritis>>Resolved GERD  P:   Heart Healthy/Carb Modified Diet Imodium Protonix Mylanta  HEMATOLOGIC A:  Leukocytosis (14.9) Anemia (10.6)  P:  Follow CBC.  SCD. Lovenox  INFECTIOUS A:   Gastroenteritis  P:   Vanc 7/06 >> 7/8 Metronidazole 7/06 >>  7/8 Ciproflixacin 7/06 >> 7/8  ENDOCRINE A:   Hyperglycemia, likely secondary to steroids H/O  Panhypopituirarism Hyperthyroidism (TSH 0.114)  P:   Monitor glucose on BMP Levothyroxine.  Desmopressin, Steroids  NEUROLOGIC A:   Hearing Loss at Baseline  P:   Continue to monitor for improvement Ensure hearing aids are in place to prevent delirium  FAMILY  - Updates: Mother and sisters at bedside. - Inter-disciplinary family meet or Palliative Care meeting due by: 09/08/14.  TODAY'S SUMMARY:  Currently on 2L [place], but wean as able. Diarrhea resolved. Mylanta initiated for GERD. Transfer to SDU.  Dr. Junie Panning Family Medicine PGY-2 09/07/2014, 9:15 PM

## 2014-09-07 NOTE — Plan of Care (Signed)
Problem: Consults Goal: Nutrition Consult-if indicated Outcome: Progressing Patient with nutrition consult.

## 2014-09-08 ENCOUNTER — Inpatient Hospital Stay (HOSPITAL_COMMUNITY): Payer: Medicare Other

## 2014-09-08 LAB — CBC
HEMATOCRIT: 32.8 % — AB (ref 36.0–46.0)
HEMOGLOBIN: 10.6 g/dL — AB (ref 12.0–15.0)
MCH: 22.5 pg — AB (ref 26.0–34.0)
MCHC: 32.3 g/dL (ref 30.0–36.0)
MCV: 69.6 fL — ABNORMAL LOW (ref 78.0–100.0)
PLATELETS: 248 10*3/uL (ref 150–400)
RBC: 4.71 MIL/uL (ref 3.87–5.11)
RDW: 17 % — AB (ref 11.5–15.5)
WBC: 14.9 10*3/uL — AB (ref 4.0–10.5)

## 2014-09-08 LAB — BASIC METABOLIC PANEL
Anion gap: 11 (ref 5–15)
BUN: 24 mg/dL — AB (ref 6–20)
CHLORIDE: 103 mmol/L (ref 101–111)
CO2: 24 mmol/L (ref 22–32)
CREATININE: 1.23 mg/dL — AB (ref 0.44–1.00)
Calcium: 8.7 mg/dL — ABNORMAL LOW (ref 8.9–10.3)
GFR calc Af Amer: >60 mL/min (ref >60)
GFR calc non Af Amer: 54 mL/min — ABNORMAL LOW (ref >60)
Glucose, Bld: 93 mg/dL (ref 65–99)
Potassium: 3.3 mmol/L — ABNORMAL LOW (ref 3.5–5.1)
SODIUM: 138 mmol/L (ref 135–145)

## 2014-09-08 LAB — GLUCOSE, CAPILLARY: Glucose-Capillary: 106 mg/dL — ABNORMAL HIGH (ref 65–99)

## 2014-09-08 MED ORDER — HYDROCORTISONE 10 MG PO TABS
10.0000 mg | ORAL_TABLET | Freq: Every day | ORAL | Status: DC
  Administered 2014-09-09 – 2014-09-10 (×2): 10 mg via ORAL
  Filled 2014-09-08 (×4): qty 1

## 2014-09-08 MED ORDER — HYDROCORTISONE 5 MG PO TABS
5.0000 mg | ORAL_TABLET | Freq: Every day | ORAL | Status: DC
  Administered 2014-09-08 – 2014-09-09 (×2): 5 mg via ORAL
  Filled 2014-09-08 (×4): qty 1

## 2014-09-08 MED ORDER — ALUM & MAG HYDROXIDE-SIMETH 200-200-20 MG/5ML PO SUSP
15.0000 mL | ORAL | Status: DC | PRN
  Filled 2014-09-08: qty 30

## 2014-09-08 MED ORDER — POTASSIUM CHLORIDE CRYS ER 20 MEQ PO TBCR
20.0000 meq | EXTENDED_RELEASE_TABLET | ORAL | Status: AC
  Administered 2014-09-08 (×2): 20 meq via ORAL
  Filled 2014-09-08 (×2): qty 1

## 2014-09-08 NOTE — Progress Notes (Signed)
[facility] ADULT ICU REPLACEMENT PROTOCOL FOR AM LAB REPLACEMENT ONLY  The patient does apply for the [facility] Adult ICU Electrolyte Replacment Protocol based on the criteria listed below:   1. Is GFR >/= 40 ml/min? Yes.    Patient's GFR today is 54 2. Is urine output >/= 0.5 ml/kg/hr for the last 6 hours? Yes.   Patient's UOP is 0.5 ml/kg/hr 3. Is BUN < 60 mg/dL? Yes.    Patient's BUN today is 24 4. Abnormal electrolyte(s): K+3.3 5. Ordered repletion with: protocol 6. If a panic level lab has been reported, has the CCM MD in charge been notified? No..   Physician:  Oneita Jolly, [place] 09/08/2014 5:07 AM

## 2014-09-08 NOTE — Care Management Note (Signed)
Case Management Note  Patient Details  Name: Beth Fitzgerald MRN: [id number] Date of Birth: Dec 31, 1975  Subjective/Objective:     Lives at home with mother.  Independent at home alone while mother works.  Has neighbors that check in on her or assist if mother gets tied up at work. In fact one of these neighbors found patient and called EMS.  Live in a third floor apartment, no elevator.  Mother, Jana Half, on fence as what is best for patient.  Wants her to be at home safe as will need to continue to work, but also fells that due to daughters mental health would be better at home with PT 2-3 days a week where she could see more of her Mom and be in familiar area.  Will need to continue to follow to see if patient could be safely discharged - will need RW on discharge if goes home, or would be better at [facility] - rehab arena.                 Action/Plan:   Expected Discharge Date:                  Expected Discharge Plan:  Skilled Nursing Facility  In-House Referral:  Clinical Social Work  Discharge planning Services     Post Acute Care Choice:    Choice offered to:     DME Arranged:    DME Agency:     HH Arranged:    [place] Agency:     Status of Service:  In process, will continue to follow  Medicare Important Message Given:    Date Medicare IM Given:    Medicare IM give by:    Date Additional Medicare IM Given:    Additional Medicare Important Message give by:     If discussed at [place] of Stay Meetings, dates discussed:    Additional Comments:  Vergie Living, RN 09/08/2014, 1:24 PM

## 2014-09-09 DIAGNOSIS — R4182 Altered mental status, unspecified: Secondary | ICD-10-CM

## 2014-09-09 DIAGNOSIS — A084 Viral intestinal infection, unspecified: Secondary | ICD-10-CM

## 2014-09-09 DIAGNOSIS — E272 Addisonian crisis: Secondary | ICD-10-CM

## 2014-09-09 LAB — RENAL FUNCTION PANEL
ALBUMIN: 2.9 g/dL — AB (ref 3.5–5.0)
Anion gap: 10 (ref 5–15)
BUN: 24 mg/dL — AB (ref 6–20)
CO2: 24 mmol/L (ref 22–32)
Calcium: 8.6 mg/dL — ABNORMAL LOW (ref 8.9–10.3)
Chloride: 105 mmol/L (ref 101–111)
Creatinine, Ser: 1.23 mg/dL — ABNORMAL HIGH (ref 0.44–1.00)
GFR calc non Af Amer: 54 mL/min — ABNORMAL LOW (ref >60)
Glucose, Bld: 91 mg/dL (ref 65–99)
Phosphorus: 4.1 mg/dL (ref 2.5–4.6)
Potassium: 3.5 mmol/L (ref 3.5–5.1)
Sodium: 139 mmol/L (ref 135–145)

## 2014-09-09 LAB — CBC WITH DIFFERENTIAL/PLATELET
BASOS PCT: 0 % (ref 0–1)
Basophils Absolute: 0 10*3/uL (ref 0.0–0.1)
EOS ABS: 0.5 10*3/uL (ref 0.0–0.7)
Eosinophils Relative: 4 % (ref 0–5)
HCT: 32.1 % — ABNORMAL LOW (ref 36.0–46.0)
HEMOGLOBIN: 10.1 g/dL — AB (ref 12.0–15.0)
LYMPHS ABS: 4.8 10*3/uL — AB (ref 0.7–4.0)
Lymphocytes Relative: 39 % (ref 12–46)
MCH: 22.5 pg — ABNORMAL LOW (ref 26.0–34.0)
MCHC: 31.5 g/dL (ref 30.0–36.0)
MCV: 71.5 fL — ABNORMAL LOW (ref 78.0–100.0)
Monocytes Absolute: 0.7 10*3/uL (ref 0.1–1.0)
Monocytes Relative: 6 % (ref 3–12)
NEUTROS ABS: 6.2 10*3/uL (ref 1.7–7.7)
NEUTROS PCT: 51 % (ref 43–77)
Platelets: 328 10*3/uL (ref 150–400)
RBC: 4.49 MIL/uL (ref 3.87–5.11)
RDW: 17.1 % — ABNORMAL HIGH (ref 11.5–15.5)
WBC: 12.2 10*3/uL — ABNORMAL HIGH (ref 4.0–10.5)

## 2014-09-09 LAB — MAGNESIUM: Magnesium: 2.1 mg/dL (ref 1.7–2.4)

## 2014-09-09 MED ORDER — PANTOPRAZOLE SODIUM 40 MG PO TBEC
40.0000 mg | DELAYED_RELEASE_TABLET | Freq: Every day | ORAL | Status: DC
  Administered 2014-09-10: 40 mg via ORAL
  Filled 2014-09-09: qty 1

## 2014-09-09 MED ORDER — PANTOPRAZOLE SODIUM 40 MG PO TBEC: 40.0000 mg | DELAYED_RELEASE_TABLET | Freq: Every day | ORAL | Status: DC

## 2014-09-09 MED ORDER — PNEUMOCOCCAL VAC POLYVALENT 25 MCG/0.5ML IJ INJ
0.5000 mL | INJECTION | INTRAMUSCULAR | Status: AC
  Administered 2014-09-10: 0.5 mL via INTRAMUSCULAR
  Filled 2014-09-09: qty 0.5

## 2014-09-09 MED ORDER — POTASSIUM CHLORIDE CRYS ER 20 MEQ PO TBCR
40.0000 meq | EXTENDED_RELEASE_TABLET | Freq: Once | ORAL | Status: AC
  Administered 2014-09-09: 40 meq via ORAL
  Filled 2014-09-09: qty 2

## 2014-09-09 NOTE — Care Management Note (Addendum)
Case Management Note  Patient Details  Name: Beth Fitzgerald MRN: [id number] Date of Birth: 1975-12-27  Subjective/Objective:        Pt lives with Mom who works - twin sister will stay with pt during the day so that she will have 24/7 assistance.  Per PT/OT, pt did well on stairs and is safe to discharge home with assistance.  Provided list of home health agencies to pt, referral made per choice.  Pt's physician is @ [facility], provided [employer] number to mother who will call to set up appointment with local physician and inform CM tomorrow so that clinical information can be provided.              Expected Discharge Plan:  Home with home health services  Discharge planning Services  CM Consult  Post Acute Care Choice:  Durable Medical Equipment, Home Health Choice offered to:  Patient  DME Arranged:  [facility] DME Agency:  [place] Arranged:  PT, OT [facility] Agency:  [place]  Status of Service:  In process, will continue to follow  Medicare Important Message Given:  [facility] notification given  Girard Cooter, RN 09/09/2014, 2:43 PM   Pt has an appointment with Dr Hayden Rasmussen @ [facility] @ [facility] on Monday, August 1st @ 10:30 a.m.  Phone [phone].  History and Physical and Discharge Summary will be faxed to [phone].

## 2014-09-09 NOTE — Progress Notes (Signed)
[place] TEAM 1 - Stepdown/ICU TEAM Progress Note  Molli Gethers PYK:[id number] DOB: 1975-04-28 DOA: 09/02/2014 PCP: No primary care provider on file.  Admit HPI / Brief Narrative: 39 y.o. 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 She was brought to the [facility] ED 09/02/14 for N/V/D x 2 days. She was apparently in her USOH prior to symptom onset. Symptoms began suddenly with nausea followed by vomiting. She then began to have diarrhea to the point she was unable to take her maintenance steroids and DDAVP. Her mother was sick 2 - 3 days prior with GI symptoms.    In ED, she was found to be febrile to 102 rectally w/ a lactate of 3.61 which later increased to 4.09 despite IVF's. Pt was tachypneic with RR into the 40's and intermittent hypoxia with SpO2 in high 80's.   HPI/Subjective: The patient is resting comfortably in a bedside chair.  She is tolerating her diet without any difficulty.  She denies shortness of breath chest pain nausea vomiting or abdominal pain.  She has been working with physical therapy this morning.  She is very anxious to go home and her family agrees that she is very close to her baseline.  Assessment/Plan:  Acute Hypoxic Respiratory Failure decompensated diastolic congestive heart failure given LVH and diastolic dysfunction seen on TTE - continue to wean FiO2 for Saturation >92% - no evidence to suggest patient will require oxygen at discharge  Acute Renal Failure Creatinine continues to fluctuate - sure patient hydrated - avoid nephrotoxic agents - recheck bmet in a.m.  Panhypopituitarism The patient's usual multi-hormone replacement therapy has been resumed - continue to follow electrolytes  Encephalopathy Resolved - secondary to hypernatremia and metabolic encephalopathy in the setting of sepsis  Sepsis due to a viral enteritis - stool, blood, and urine cultures all negative - no clinical evidence of an  active infection at this time  Anemia No signs of active bleeding - hemoglobin stable  Severe Hypernatremia Resolved  Hypokalemia Potassium improving - supplement further today and follow-up in a.m.  Hyperchloremia Resolved  Metabolic Acidosis/Lactic Acidosis Resolved  Code Status: FULL Family Communication: Spoke with patient's mother and daughter at bedside Disposition Plan: Plan for discharge home in a.m. if electrolytes remained stable - will not transfer to medical bed due to pending discharge  Consultants: PCCM  Procedures: None  Antibiotics: Ciprofloxacin 7/6 > 7/7 Zosyn 7/6 Vancomycin 7/6 > 7/7 Flagyl 7/7 > 7/8  DVT prophylaxis: Lovenox  Objective: Blood pressure 107/79, pulse 108, temperature 98.2 F (36.8 C), temperature source Oral, resp. rate 21, height 5' (1.524 m), weight 72.4 kg (159 lb 9.8 oz), last menstrual period 08/10/2014, SpO2 92 %.  Intake/Output Summary (Last 24 hours) at 09/09/14 1309 Last data filed at 09/09/14 0939  Gross per 24 hour  Intake    520 ml  Output      1 ml  Net    519 ml   Exam: General: No acute respiratory distress - alert and conversant Lungs: Clear to auscultation bilaterally without wheezes or crackles Cardiovascular: Regular rate and rhythm without murmur gallop or rub normal S1 and S2 Abdomen: Nontender, nondistended, soft, bowel sounds positive, no rebound, no ascites, no appreciable mass Extremities: No significant cyanosis, clubbing, or edema bilateral lower extremities  Data Reviewed: Basic Metabolic Panel:  Recent Labs Lab 09/06/14 1746 09/07/14 0229 09/07/14 1830 09/08/14 0404 09/09/14 0246 09/09/14 0530  NA 138 137 137 138  --  139  K 4.1 3.8 3.3* 3.3*  --  3.5  CL 104 106 101 103  --  105  CO2 22 20* 24 24  --  24  GLUCOSE 141* 83 121* 93  --  91  BUN 13 18 19  24*  --  24*  CREATININE 1.10* 1.08* 1.21* 1.23*  --  1.23*  CALCIUM 8.1* 7.9* 8.7* 8.7*  --  8.6*  MG  --   --   --   --  2.1  --     PHOS  --   --   --   --   --  4.1    CBC:  Recent Labs Lab 09/05/14 0430 09/06/14 0500 09/07/14 1830 09/08/14 0404 09/09/14 0442  WBC 20.3* 15.9* 16.9* 14.9* 12.2*  NEUTROABS  --   --   --   --  6.2  HGB 8.2* 10.0* 11.2* 10.6* 10.1*  HCT 26.5* 31.3* 34.9* 32.8* 32.1*  MCV 71.6* 70.7* 69.2* 69.6* 71.5*  PLT 202 214 226 248 328    Liver Function Tests:  Recent Labs Lab 09/03/14 1509 09/04/14 0430 09/09/14 0530  AST 29 26  --   ALT 19 18  --   ALKPHOS 53 51  --   BILITOT 0.5 0.3  --   PROT 5.4* 4.7*  --   ALBUMIN 2.3* 2.0* 2.9*    Recent Labs Lab 09/02/14 1820  LIPASE 12*  AMYLASE 17*   Cardiac Enzymes:  Recent Labs Lab 09/06/14 1000  TROPONINI 0.13*    CBG:  Recent Labs Lab 09/06/14 1619 09/07/14 0820 09/07/14 1211 09/07/14 1630 09/07/14 2313  GLUCAP 160* 102* 159* 120* 106*    Recent Results (from the past 240 hour(s))  Blood Culture (routine x 2)     Status: None   Collection Time: 09/02/14 11:00 AM  Result Value Ref Range Status   Specimen Description BLOOD RIGHT HAND  Final   Special Requests BOTTLES DRAWN AEROBIC ONLY 5ML  Final   Culture NO GROWTH 5 DAYS  Final   Report Status 09/07/2014 FINAL  Final  Blood Culture (routine x 2)     Status: None   Collection Time: 09/02/14 11:35 AM  Result Value Ref Range Status   Specimen Description BLOOD LEFT HAND  Final   Special Requests BOTTLES DRAWN AEROBIC ONLY 5ML  Final   Culture NO GROWTH 5 DAYS  Final   Report Status 09/07/2014 FINAL  Final  Urine culture     Status: None   Collection Time: 09/02/14  1:09 PM  Result Value Ref Range Status   Specimen Description URINE, RANDOM  Final   Special Requests NONE  Final   Culture NO GROWTH 1 DAY  Final   Report Status 09/03/2014 FINAL  Final  MRSA PCR Screening     Status: None   Collection Time: 09/02/14  5:34 PM  Result Value Ref Range Status   MRSA by PCR NEGATIVE NEGATIVE Final    Comment:        The GeneXpert MRSA Assay  (FDA approved for NASAL specimens only), is one component of a comprehensive MRSA colonization surveillance program. It is not intended to diagnose MRSA infection nor to guide or monitor treatment for MRSA infections.   Clostridium Difficile by PCR (not at [facility])     Status: None   Collection Time: 09/02/14 10:00 PM  Result Value Ref Range Status   C difficile by pcr NEGATIVE NEGATIVE Final  Stool culture     Status: None  Collection Time: 09/02/14 10:00 PM  Result Value Ref Range Status   Specimen Description STOOL  Final   Special Requests NONE  Final   Culture   Final    NO SALMONELLA, SHIGELLA, CAMPYLOBACTER, YERSINIA, OR E.COLI 0157:H7 ISOLATED Performed at [facility]    Report Status 09/06/2014 FINAL  Final     Studies:   Recent x-ray studies have been reviewed in detail by the Attending Physician  Scheduled Meds:  Scheduled Meds: . antiseptic oral rinse  7 mL Mouth Rinse BID  . aspirin EC  81 mg Oral Daily  . calcium-vitamin D  1 tablet Oral Q breakfast  . desmopressin  1 spray Nasal BID  . enoxaparin (LOVENOX) injection  40 mg Subcutaneous Q24H  . feeding supplement (GLUCERNA SHAKE)  237 mL Oral TID BM  . hydrocortisone  10 mg Oral QAC breakfast  . hydrocortisone  5 mg Oral Q supper  . levothyroxine  100 mcg Oral QAC breakfast  . pantoprazole  40 mg Oral Daily  . sodium chloride  10-40 mL Intracatheter Q12H    Time spent on care of this patient: 35 mins   Raffaele Derise T , MD   [facility] Office  [phone] Pager - Text Page per Shea Evans as per below:  On-Call/Text Page:      [email or web address]      password [email or web address]  If 7PM-7AM, please contact night-coverage [email or web address] Password [email or web address] 09/09/2014, 1:09 PM   LOS: 7 days

## 2014-09-09 NOTE — Evaluation (Signed)
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 Admited via ED 7/06 to ICU/PCCM service with one day history of N/V/D, lactic acidosis, fever.Concern for occult shock.   Clinical Impression   Pt was independent in mobility and ADL prior to admission.  She presents with balance deficits and generalized weakness interfering with ability to perform self care and ADL transfers.  Pt has a supportive family and is eager to go home.  Will follow acutely.  Recommended pt and family consider a tub seat for safety and energy conservation and to supervise showering initially.      Follow Up Recommendations  Home health OT    Equipment Recommendations       Recommendations for Other Services       Precautions / Restrictions Precautions Precautions: Fall Precaution Comments: B hearing aids, however does not hear well out of the L side. Better to be standing on her right Restrictions Weight Bearing Restrictions: No      Mobility Bed Mobility               General bed mobility comments: pt in chair, returned to chair  Transfers Overall transfer level: Needs assistance Equipment used: Rolling walker (2 wheeled) Transfers: Sit to/from Stand Sit to Stand: Min guard         General transfer comment: no physical assist, assisted for safety, minimal reliance on arms of chair to stand    Balance Overall balance assessment: Needs assistance   Sitting balance-Leahy Scale: Good       Standing balance-Leahy Scale: Fair                              ADL Overall ADL's : Needs assistance/impaired Eating/Feeding: Independent;Sitting   Grooming: Wash/dry hands;Standing;Min guard   Upper Body  Bathing: Set up;Sitting   Lower Body Bathing: Min guard;Sit to/from stand   Upper Body Dressing : Set up;Sitting   Lower Body Dressing: Min guard;Sit to/from stand   Toilet Transfer: Min guard;Ambulation;Regular Museum/gallery exhibitions officer and Hygiene: Min guard;Sit to/from stand       Functional mobility during ADLs: Min guard;Rolling walker General ADL Comments: Recommended family get a shower seat and supervise tub transfers initially.     Vision Additional Comments: can read small print   Perception     Praxis      Pertinent Vitals/Pain Pain Assessment: No/denies pain     Hand Dominance Right   Extremity/Trunk Assessment Upper Extremity Assessment Upper Extremity Assessment: Overall WFL for tasks assessed   Lower Extremity Assessment Lower Extremity Assessment: Defer to PT evaluation   Cervical / Trunk Assessment Cervical / Trunk Exceptions: Forward head posture/ rounded shoulders   Communication Communication Communication: No difficulties   Cognition Arousal/Alertness: Awake/alert Behavior During Therapy: WFL for tasks assessed/performed Overall Cognitive Status: Within Functional Limits for tasks assessed                     General Comments       Exercises       Shoulder Instructions      Home Living Family/patient expects to be discharged to:: Private residence Living Arrangements: Parent Available Help at Discharge: Family;Friend(s);Neighbor;Available PRN/intermittently Type of Home:  Apartment Home Access: Stairs to enter Entrance Stairs-Number of Steps: 3 flights Entrance Stairs-Rails: Right;Left Home Layout: One level     Bathroom Shower/Tub: Teacher, early years/pre: Standard     Home Equipment: None          Prior Functioning/Environment Level of Independence: Independent        Comments: Pt has a dog that she was taking out for walks PTA, going up and down 3 flights of steps multiple times  a day.     OT Diagnosis: Generalized weakness   OT Problem List: Decreased activity tolerance;Impaired balance (sitting and/or standing);Decreased knowledge of use of DME or AE   OT Treatment/Interventions: Self-care/ADL training;DME and/or AE instruction;Therapeutic activities;Patient/family education;Balance training    OT Goals(Current goals can be found in the care plan section) Acute Rehab OT Goals Patient Stated Goal: Get back to her baseline OT Goal Formulation: With patient Time For Goal Achievement: 09/16/14 Potential to Achieve Goals: Good ADL Goals Pt Will Perform Grooming: with modified independence;standing Pt Will Perform Lower Body Bathing: with modified independence;sit to/from stand Pt Will Perform Lower Body Dressing: with modified independence;sit to/from stand Pt Will Transfer to Toilet: with modified independence;ambulating;regular height toilet Pt Will Perform Toileting - Clothing Manipulation and hygiene: with modified independence;sit to/from stand Pt Will Perform Tub/Shower Transfer: Tub transfer;with supervision;ambulating;shower seat;rolling walker  OT Frequency: Min 2X/week   Barriers to D/C:            Co-evaluation PT/OT/SLP Co-Evaluation/Treatment: Yes Reason for Co-Treatment: For patient/therapist safety          End of Session Equipment Utilized During Treatment: Gait belt;Rolling walker  Activity Tolerance: Patient tolerated treatment well Patient left: in chair;with call bell/phone within reach   Time: 2620-3559 OT Time Calculation (min): 36 min Charges:  OT General Charges $OT Visit: 1 Procedure OT Evaluation $Initial OT Evaluation Tier I: 1 Procedure G-Codes:    Malka So 09/09/2014, 1:19 PM  [phone]

## 2014-09-09 NOTE — Care Management Important Message (Signed)
Important Message  Patient Details  Name: Beth Fitzgerald MRN: [id number] Date of Birth: 04-01-1975   Medicare Important Message Given:  Yes-second notification given    Nathen May 09/09/2014, 2:15 [place] Message  Patient Details  Name: Beth Fitzgerald MRN: [id number] Date of Birth: 1976/01/11   Medicare Important Message Given:  Yes-second notification given    Nathen May 09/09/2014, 2:15 PM

## 2014-09-09 NOTE — Progress Notes (Signed)
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 Admited via ED 7/06 to ICU/PCCM service with one day history of N/V/D, lactic acidosis, fever.Concern for occult shock.    PT Comments    Pt admitted with above diagnosis. Pt currently with functional limitations due to balance and endurance deficits. Pt able to ambulate with no significant LOB with RW.  Pt approaching baseline and feels close to back to normal.  Spoke with pt and mom about going home with initial close to 24 hour care with HHPT f/u and use RW.  Pt and mom feel comfortable.  Gave exercise program as well.  Will continue acute PT.  Pt will benefit from skilled PT to increase their independence and safety with mobility to allow discharge to the venue listed below.    Follow Up Recommendations  Home health PT;Supervision/Assistance - 24 hour     Equipment Recommendations  Rolling walker with 5" wheels (needs a youth RW as pt is 5 feet tall)    Recommendations for Other Services       Precautions / Restrictions Precautions Precautions: Fall Precaution Comments: B hearing aids, however does not hear well out of the L side. Better to be standing on her right Restrictions Weight Bearing Restrictions: No    Mobility  Bed Mobility               General bed mobility comments: pt in chair, returned to chair  Transfers Overall transfer level: Needs assistance Equipment used: Rolling walker (2 wheeled) Transfers: Sit to/from Stand Sit to Stand: Min guard         General transfer comment: no physical assist, assisted for safety, minimal reliance on arms of chair to stand  Ambulation/Gait Ambulation/Gait assistance: Min guard Ambulation Distance  (Feet): 200 Feet Assistive device: Rolling walker (2 wheeled) Gait Pattern/deviations: Step-through pattern;Decreased stride length;Antalgic;Trunk flexed;Wide base of support Gait velocity: Decreased Gait velocity interpretation: Below normal speed for age/gender General Gait Details: Pt able to use RW with guard assist.  No significant LOB.  Catches toe on left occasionally. Pt states this is her baseline.  Definitely wants a RW for home use.      Stairs Stairs: Yes Stairs assistance: Min guard Stair Management: One rail Left;Alternating pattern;Step to pattern;Forwards;Sideways Number of Stairs: 24 General stair comments: Pt ascended and descended steps ascending going alternating with rail and descending step to pattern with rail.  Step to pattern slows pt down which increases safety.  Pointed this out to pt.   Wheelchair Mobility    Modified Rankin (Stroke Patients Only)       Balance Overall balance assessment: Needs assistance   Sitting balance-Leahy Scale: Good     Standing balance support: No upper extremity supported;During functional activity Standing balance-Leahy Scale: Fair Standing balance comment: stood at sink and washed hands without UE support.                     Cognition Arousal/Alertness: Awake/alert Behavior During Therapy: WFL for tasks assessed/performed Overall Cognitive Status: Within Functional Limits for tasks assessed                      Exercises  Gave exercise program for standing exercises hip flexion, hip extension, hip abduction,  squats, and two supine exercises - SAQ and SLR.      General Comments        Pertinent Vitals/Pain Pain Assessment: No/denies pain  VSS    Home Living Family/patient expects to be discharged to:: Private residence Living Arrangements: Parent Available Help at Discharge: Family;Friend(s);Neighbor;Available PRN/intermittently Type of Home: Apartment Home Access: Stairs to enter Entrance  Stairs-Rails: Right;Left Home Layout: One level Home Equipment: None      Prior Function Level of Independence: Independent      Comments: Pt has a dog that she was taking out for walks PTA, going up and down 3 flights of steps multiple times a day.    PT Goals (current goals can now be found in the care plan section) Acute Rehab PT Goals Patient Stated Goal: Get back to her baseline Progress towards PT goals: Progressing toward goals    Frequency  Min 3X/week    PT Plan Discharge plan needs to be updated    Co-evaluation PT/OT/SLP Co-Evaluation/Treatment: Yes Reason for Co-Treatment: For patient/therapist safety PT goals addressed during session: Mobility/safety with mobility       End of Session Equipment Utilized During Treatment: Gait belt Activity Tolerance: Patient tolerated treatment well Patient left: in chair;with call bell/phone within reach;with family/visitor present     Time: 4259-5638 PT Time Calculation (min) (ACUTE ONLY): 37 min  Charges:  $Gait Training: 8-22 mins                    G CodesDenice Paradise Sep 24, 2014, 1:28 PM [place] Marin Milley,PT Acute Rehabilitation [phone] (pager)

## 2014-09-09 NOTE — Progress Notes (Signed)
Noted progress with PT and OT. HH appropriate. [phone]

## 2014-09-09 NOTE — Progress Notes (Signed)
Pt resting comfortably today in chair most of day with family at bedside. Using BR with help ambulates with 1 assist using gait belt.

## 2014-09-10 DIAGNOSIS — G9340 Encephalopathy, unspecified: Secondary | ICD-10-CM | POA: Diagnosis present

## 2014-09-10 DIAGNOSIS — A419 Sepsis, unspecified organism: Secondary | ICD-10-CM | POA: Diagnosis not present

## 2014-09-10 DIAGNOSIS — I517 Cardiomegaly: Secondary | ICD-10-CM | POA: Diagnosis present

## 2014-09-10 DIAGNOSIS — E872 Acidosis, unspecified: Secondary | ICD-10-CM | POA: Diagnosis present

## 2014-09-10 DIAGNOSIS — N179 Acute kidney failure, unspecified: Secondary | ICD-10-CM | POA: Diagnosis present

## 2014-09-10 DIAGNOSIS — E878 Other disorders of electrolyte and fluid balance, not elsewhere classified: Secondary | ICD-10-CM | POA: Diagnosis present

## 2014-09-10 DIAGNOSIS — E870 Hyperosmolality and hypernatremia: Secondary | ICD-10-CM

## 2014-09-10 DIAGNOSIS — I5032 Chronic diastolic (congestive) heart failure: Secondary | ICD-10-CM | POA: Diagnosis present

## 2014-09-10 DIAGNOSIS — IMO0001 Reserved for inherently not codable concepts without codable children: Secondary | ICD-10-CM | POA: Diagnosis present

## 2014-09-10 DIAGNOSIS — E876 Hypokalemia: Secondary | ICD-10-CM | POA: Diagnosis present

## 2014-09-10 DIAGNOSIS — J9601 Acute respiratory failure with hypoxia: Secondary | ICD-10-CM | POA: Diagnosis present

## 2014-09-10 DIAGNOSIS — E230 Hypopituitarism: Secondary | ICD-10-CM | POA: Diagnosis present

## 2014-09-10 DIAGNOSIS — R410 Disorientation, unspecified: Secondary | ICD-10-CM

## 2014-09-10 LAB — BASIC METABOLIC PANEL
Anion gap: 10 (ref 5–15)
BUN: 23 mg/dL — AB (ref 6–20)
CALCIUM: 8.7 mg/dL — AB (ref 8.9–10.3)
CHLORIDE: 107 mmol/L (ref 101–111)
CO2: 24 mmol/L (ref 22–32)
Creatinine, Ser: 1.13 mg/dL — ABNORMAL HIGH (ref 0.44–1.00)
GFR calc Af Amer: >60 mL/min (ref >60)
GFR calc non Af Amer: 60 mL/min — ABNORMAL LOW (ref >60)
Glucose, Bld: 85 mg/dL (ref 65–99)
Potassium: 3.4 mmol/L — ABNORMAL LOW (ref 3.5–5.1)
SODIUM: 141 mmol/L (ref 135–145)

## 2014-09-10 LAB — CBC
HCT: 30.9 % — ABNORMAL LOW (ref 36.0–46.0)
Hemoglobin: 9.6 g/dL — ABNORMAL LOW (ref 12.0–15.0)
MCH: 22.2 pg — ABNORMAL LOW (ref 26.0–34.0)
MCHC: 31.1 g/dL (ref 30.0–36.0)
MCV: 71.5 fL — AB (ref 78.0–100.0)
Platelets: 302 10*3/uL (ref 150–400)
RBC: 4.32 MIL/uL (ref 3.87–5.11)
RDW: 18.2 % — ABNORMAL HIGH (ref 11.5–15.5)
WBC: 8.9 10*3/uL (ref 4.0–10.5)

## 2014-09-10 MED ORDER — DESMOPRESSIN ACETATE SPRAY 0.01 % NA SOLN: 1.0000 | Freq: Two times a day (BID) | NASAL | Status: AC

## 2014-09-10 MED ORDER — HYDROCORTISONE 5 MG PO TABS: 5.0000 mg | ORAL_TABLET | Freq: Every day | ORAL | Status: AC

## 2014-09-10 MED ORDER — HYDROCORTISONE 10 MG PO TABS: 10.0000 mg | ORAL_TABLET | Freq: Every day | ORAL | Status: AC

## 2014-09-10 MED ORDER — POTASSIUM CHLORIDE CRYS ER 20 MEQ PO TBCR: 40.0000 meq | EXTENDED_RELEASE_TABLET | Freq: Once | ORAL | Status: DC

## 2014-09-10 NOTE — Progress Notes (Signed)
Pt has an appointment with Dr Hayden Rasmussen @ [facility] @ [facility] on Monday, August 1st @ 10:30 a.m. Phone [phone]. History and Physical and Discharge Summary will be faxed to [phone].

## 2014-09-10 NOTE — Discharge Summary (Signed)
Physician Discharge Summary  Beth Fitzgerald ZOX:[id number] DOB: [birth year] DOA: 09/02/2014  PCP: No primary care provider on file.  Admit date: 09/02/2014 Discharge date: 09/10/2014  Time spent: 40 minutes  Recommendations for Outpatient Follow-up:  Acute Hypoxic Respiratory Failure -Decompensated diastolic congestive heart failure given LVH and diastolic dysfunction seen on TTE  -Resolved.  Acute Renal Failure -Cr continues to trend down, we have no baseline to compare to secondary to patient receiving her care in [place].  -Would continue to avoid nephrotoxic medication   Panhypopituitarism -The patient's usual multi-hormone replacement therapy has been resumed  - Counseled patient, sister and mother to obtain BMP, magnesium within one week of discharge. They will contact physician in [place] to request lab Core order, prior to following up with Dr. Hayden Rasmussen be her new PCP in [facility]. -Per mother patient has follow-up appointment in August with Dr. Red Christians to establish care  Encephalopathy -Resolved   Sepsis due to unspecified organism -due to a viral enteritis; stool, blood, and urine cultures all negative  - no clinical evidence of an active infection at this time; patient able to eat her regular meals without N/V/D  Chronic diastolic CHF/LVH severe -Counseled patient and family on controlling patient's BP  -BP well controlled without medication -PCP to monitor  Anemia -No signs of active bleeding - hemoglobin stable  Severe Hypernatremia -Resolved  Hypokalemia -Potassium goal > 4  -K-Dur 40 mEq 40   Hyperchloremia Resolved  Metabolic Acidosis/Lactic Acidosis Resolved     Discharge Diagnoses:  Active Problems:   Sepsis   Severe sepsis   Altered mental status   Respiratory failure   Acute respiratory failure with hypoxia   Acute renal failure syndrome   Panhypopituitarism   Acute encephalopathy   Blood poisoning   Chronic diastolic  CHF (congestive heart failure)   LVH (left ventricular hypertrophy)   Hypernatremia   Hypokalemia   Hyperchloremia   Metabolic acidosis   Discharge Condition: Stable  Diet recommendation: Regular  Filed Weights   09/04/14 0500 09/05/14 0500 09/08/14 0500  Weight: 75 kg (165 lb 5.5 oz) 77.7 kg (171 lb 4.8 oz) 72.4 kg (159 lb 9.8 oz)    History of present illness:  39 y.o. 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   She was brought to the [facility] ED 09/02/14 for N/V/D x 2 days. She was apparently in her USOH prior to symptom onset. Symptoms began suddenly with nausea followed by vomiting. She then began to have diarrhea to the point she was unable to take her maintenance steroids and DDAVP. Her mother was sick 2 - 3 days prior with GI symptoms.   In ED, she was found to be febrile to 102 rectally w/ a lactate of 3.61 which later increased to 4.09 despite IVF's. Pt was tachypneic with RR into the 40's and intermittent hypoxia with SpO2 in high 80's.  During his hospitalization patient was found to have viral gastroenteritis which resulted in patient not being able to take regular medication for panhypopituitarism. As patient's gastritis resolved patient was able to resume her regular medication and all signs and symptoms resolved.    Procedures: 7/11 echocardiogram;- Left ventricle: severe LVH. LVEF= 55%- 60%. - (grade 1 diastolic dysfunction).   Consultations: Clovis Fredrickson [facility] M)   Antibiotics    Discharge Exam: Filed Vitals:   09/10/14 0020 09/10/14 0420 09/10/14 0815 09/10/14 1221  BP: 118/77 118/81 116/79 114/80  Pulse: 98 97 90 90  Temp: 98 F (36.7 C) 98.4 F (36.9 C) 98.2 F (36.8 C) 98.2 F (36.8 C)  TempSrc: Oral Oral Oral Oral  Resp: 24 25 21 19   Height:      Weight:      SpO2: 96% 95% 94% 94%    General: A/O 4, NAD, Cardiovascular: Regular rhythm or rate, negative murmurs rubs or gallops,  normal S1/S2 Respiratory: Clear to auscultation bilateral  Discharge Instructions     Medication List    TAKE these medications        aspirin EC 81 MG tablet  Take 81 mg by mouth daily.     CALCIUM 600+D 600-800 MG-UNIT Tabs  Generic drug:  Calcium Carb-Cholecalciferol  Take 1 tablet by mouth daily.     desmopressin 0.01 % solution  Commonly known as:  DDAVP NASAL  Place 1 spray (10 mcg total) into the nose 2 (two) times daily.     hydrocortisone 10 MG tablet  Commonly known as:  CORTEF  Take 1 tablet (10 mg total) by mouth daily before breakfast.     hydrocortisone 5 MG tablet  Commonly known as:  CORTEF  Take 1 tablet (5 mg total) by mouth daily with supper.     levothyroxine 100 MCG tablet  Commonly known as:  SYNTHROID, LEVOTHROID  Take 100 mcg by mouth daily before breakfast.     NEXIUM 40 MG capsule  Generic drug:  esomeprazole  Take 40 mg by mouth daily.     TRINESSA (28) 0.18/0.215/0.25 MG-35 MCG tablet  Generic drug:  Norgestimate-Ethinyl Estradiol Triphasic  Take 1 tablet by mouth daily.       Allergies  Allergen Reactions  . Iodine Swelling    pts fingers swell  . Sulfa Antibiotics Swelling    pts fingers swell   Follow-up Information    Schedule an appointment as soon as possible for a visit with Red Christians, MD.   Specialty:  Oncology   Why:  Patient with scheduled appointment in August per mother   Contact information:   [address] [place] [address] [phone]        The results of significant diagnostics from this hospitalization (including imaging, microbiology, ancillary and laboratory) are listed below for reference.    Significant Diagnostic Studies: Dg Abd 1 View  09/03/2014   CLINICAL DATA:  Feeding tube placement by the fluoroscopy technologist.  EXAM: ABDOMEN - 1 VIEW  COMPARISON:  None.  FINDINGS: A single C-arm view of the mid abdomen demonstrates a feeding tube and injected contrast. The injected contrast is  filling the duodenum and proximal jejunum. The feeding tube tip is at the junction of the second and third portions of the duodenum.  IMPRESSION: Feeding tube tip at the junction of the second and third portions of the duodenum.   Electronically Signed   By: Claudie Revering M.D.   On: 09/03/2014 15:36   Ct Head Wo Contrast  09/03/2014   CLINICAL DATA:  Altered mental status  EXAM: CT HEAD WITHOUT CONTRAST  TECHNIQUE: Contiguous axial images were obtained from the base of the skull through the vertex without intravenous contrast.  COMPARISON:  None.  FINDINGS: No skull fracture is noted. There is mucosal thickening with partial opacification left maxillary sinus. The mastoid air cells are unremarkable.  No intracranial hemorrhage, mass effect or midline shift. Moderate cerebral atrophy. Mild periventricular white matter decreased attenuation probable due to chronic small vessel ischemic changes. Extensive bilateral basal ganglia calcifications are noted.  No definite acute cortical infarction. No mass lesion is noted on this unenhanced scan. Small punctate calcification is noted in left thalamus. There is small high density focus in left frontal lobe measures 81 Hounsfield units in attenuation. Given extensive calcification in basal ganglia this may represent a small calcification however focal small hemorrhage may have the same appearance. Clinical correlation is necessary.  IMPRESSION: There is no intraventricular hemorrhage. There is small high density focus in left frontal lobe measures 81 Hounsfield units in attenuation. Given extensive calcification in basal ganglia this may represent a small calcification however focal small hemorrhage may have the same appearance. Clinical correlation is necessary. Extensive bilateral basal ganglia calcifications are noted. Mucosal thickening with partial opacification left maxillary sinus. Moderate cerebral atrophy. Periventricular white matter decreased attenuation probable  due to chronic small vessel ischemic changes. No definite acute cortical infarction.   Electronically Signed   By: Lahoma Crocker M.D.   On: 09/03/2014 11:19   Dg Chest Port 1 View  09/08/2014   CLINICAL DATA:  Shortness of breath.  EXAM: PORTABLE CHEST - 1 VIEW  COMPARISON:  09/07/2014.  FINDINGS: Right PICC line stable position. Mediastinum hilar structures are unremarkable. Heart size normal. Interim improvement of bilateral mid lung field and basilar atelectasis and/or infiltrates. Small left pleural effusion. No pneumothorax  IMPRESSION: 1. Right PICC line in stable position. 2. Interim improvement of bilateral mid lung field and bibasilar subsegmental atelectasis and/or infiltrates. Small left pleural effusion.   Electronically Signed   By: Marcello Moores  Register   On: 09/08/2014 07:15   Dg Chest Port 1 View  09/07/2014   CLINICAL DATA:  Respiratory failure.  EXAM: PORTABLE CHEST - 1 VIEW  COMPARISON:  09/06/2014.  FINDINGS: Right PICC line in stable position. Heart size stable. Low lung volumes with bibasilar and bilateral mid lung field atelectasis and/or infiltrates. Small left pleural effusion cannot be excluded. No pneumothorax.  IMPRESSION: 1.  PICC line stable position.  2. Bilateral mid lung field and bibasilar subsegmental atelectasis and/or infiltrates.   Electronically Signed   By: Marcello Moores  Register   On: 09/07/2014 07:18   Dg Chest Port 1 View  09/06/2014   CLINICAL DATA:  Respiratory failure.  EXAM: PORTABLE CHEST - 1 VIEW  COMPARISON:  09/04/2014 and prior studies  FINDINGS: Cardiomediastinal silhouette is unchanged.  Bilateral airspace opacities are slightly increased.  Left lower lung consolidation/atelectasis again noted.  A right PICC line is present with tip overlying the lower SVC.  An endotracheal tube and small bore feeding tube have been removed.  There is no evidence of pneumothorax.  IMPRESSION: Increasing bilateral airspace opacities/ edema. Status post endotracheal tube and small bore  feeding tube removal.  Unchanged left lower lung consolidation/atelectasis.  No other significant changes.   Electronically Signed   By: Margarette Canada M.D.   On: 09/06/2014 09:56   Dg Chest Port 1 View  09/04/2014   CLINICAL DATA:  Lactic acidosis, nausea, vomiting and diarrhea. Panhypopituitarism. Intubated patient.  EXAM: PORTABLE CHEST - 1 VIEW  COMPARISON:  Plain film of the chest 09/03/2014.  FINDINGS: Right PICC and endotracheal tube remain in place. A feeding tube is also identified with the tip in the duodenum, unchanged. Lung volumes are low with mild subsegmental atelectasis in the bases. No pneumothorax or pleural effusion. Heart size normal.  IMPRESSION: Support apparatus project in good position.  Subsegmental bibasilar atelectasis.   Electronically Signed   By: Inge Rise M.D.   On: 09/04/2014 07:25  Portable Chest Xray  09/03/2014   CLINICAL DATA:  Endotracheal tube placement  EXAM: PORTABLE CHEST - 1 VIEW  COMPARISON:  09/03/2014  FINDINGS: Cardiomediastinal silhouette is stable. Stable right arm PICC line position. There is endotracheal tube in place with tip 1.7 cm above the carina. No pneumothorax. Persistent patchy bilateral lower lobe infiltrates.  IMPRESSION: Stable right arm PICC line position. There is endotracheal tube in place with tip 1.7 cm above the carina. No pneumothorax. Persistent patchy bilateral lower lobe infiltrates.   Electronically Signed   By: Lahoma Crocker M.D.   On: 09/03/2014 10:54   Dg Chest Port 1 View  09/03/2014   CLINICAL DATA:  Respiratory distress.  Shortness of breath .  EXAM: PORTABLE CHEST - 1 VIEW  COMPARISON:  09/02/2014.  FINDINGS: Right PICC line noted with tip at cavoatrial junction. Mediastinum hilar structures are normal. Heart size normal. Bilateral patchy pulmonary infiltrates suggesting pneumonia. Low lung volumes with basilar atelectasis. No prominent pleural effusion. No pneumothorax.  IMPRESSION: 1. PICC line in good anatomic position. 2.  Patchy bilateral pulmonary infiltrates suggesting pneumonia. 3. Low lung volumes with basilar atelectasis.   Electronically Signed   By: Marcello Moores  Register   On: 09/03/2014 07:11   Dg Chest Port 1 View  09/02/2014   CLINICAL DATA:  39 year old female with shortness of breath and cough. Initial encounter.  EXAM: PORTABLE CHEST - 1 VIEW  COMPARISON:  None.  FINDINGS: Portable AP semi upright view at 1311 hrs. Low lung volumes. Normal cardiac size and mediastinal contours. Visualized tracheal air column is within normal limits. No pneumothorax, pulmonary edema, pleural effusion or confluent pulmonary opacity. Levoconvex thoracolumbar scoliosis. No acute osseous abnormality identified.  IMPRESSION: Low lung volumes, otherwise no acute cardiopulmonary abnormality.   Electronically Signed   By: Genevie Ann M.D.   On: 09/02/2014 13:20   Dg Addison Bailey G Tube Plc W/fl-no Rad  09/03/2014   CLINICAL DATA:    NASO G TUBE PLACEMENT WITH FLUORO  Fluoroscopy was utilized by the requesting physician.  No radiographic  interpretation.     Microbiology: Recent Results (from the past 240 hour(s))  Blood Culture (routine x 2)     Status: None   Collection Time: 09/02/14 11:00 AM  Result Value Ref Range Status   Specimen Description BLOOD RIGHT HAND  Final   Special Requests BOTTLES DRAWN AEROBIC ONLY 5ML  Final   Culture NO GROWTH 5 DAYS  Final   Report Status 09/07/2014 FINAL  Final  Blood Culture (routine x 2)     Status: None   Collection Time: 09/02/14 11:35 AM  Result Value Ref Range Status   Specimen Description BLOOD LEFT HAND  Final   Special Requests BOTTLES DRAWN AEROBIC ONLY 5ML  Final   Culture NO GROWTH 5 DAYS  Final   Report Status 09/07/2014 FINAL  Final  Urine culture     Status: None   Collection Time: 09/02/14  1:09 PM  Result Value Ref Range Status   Specimen Description URINE, RANDOM  Final   Special Requests NONE  Final   Culture NO GROWTH 1 DAY  Final   Report Status 09/03/2014 FINAL  Final  MRSA  PCR Screening     Status: None   Collection Time: 09/02/14  5:34 PM  Result Value Ref Range Status   MRSA by PCR NEGATIVE NEGATIVE Final    Comment:        The GeneXpert MRSA Assay (FDA approved for NASAL specimens only), is one  component of a comprehensive MRSA colonization surveillance program. It is not intended to diagnose MRSA infection nor to guide or monitor treatment for MRSA infections.   Clostridium Difficile by PCR (not at [facility])     Status: None   Collection Time: 09/02/14 10:00 PM  Result Value Ref Range Status   C difficile by pcr NEGATIVE NEGATIVE Final  Stool culture     Status: None   Collection Time: 09/02/14 10:00 PM  Result Value Ref Range Status   Specimen Description STOOL  Final   Special Requests NONE  Final   Culture   Final    NO SALMONELLA, SHIGELLA, CAMPYLOBACTER, YERSINIA, OR E.COLI 0157:H7 ISOLATED Performed at [facility]    Report Status 09/06/2014 FINAL  Final     Labs: Basic Metabolic Panel:  Recent Labs Lab 09/07/14 0229 09/07/14 1830 09/08/14 0404 09/09/14 0246 09/09/14 0530 09/10/14 0604  NA 137 137 138  --  139 141  K 3.8 3.3* 3.3*  --  3.5 3.4*  CL 106 101 103  --  105 107  CO2 20* 24 24  --  24 24  GLUCOSE 83 121* 93  --  91 85  BUN 18 19 24*  --  24* 23*  CREATININE 1.08* 1.21* 1.23*  --  1.23* 1.13*  CALCIUM 7.9* 8.7* 8.7*  --  8.6* 8.7*  MG  --   --   --  2.1  --   --   PHOS  --   --   --   --  4.1  --    Liver Function Tests:  Recent Labs Lab 09/03/14 1509 09/04/14 0430 09/09/14 0530  AST 29 26  --   ALT 19 18  --   ALKPHOS 53 51  --   BILITOT 0.5 0.3  --   PROT 5.4* 4.7*  --   ALBUMIN 2.3* 2.0* 2.9*   No results for input(s): LIPASE, AMYLASE in the last 168 hours. No results for input(s): AMMONIA in the last 168 hours. CBC:  Recent Labs Lab 09/06/14 0500 09/07/14 1830 09/08/14 0404 09/09/14 0442 09/10/14 0604  WBC 15.9* 16.9* 14.9* 12.2* 8.9  NEUTROABS  --   --   --  6.2  --   HGB  10.0* 11.2* 10.6* 10.1* 9.6*  HCT 31.3* 34.9* 32.8* 32.1* 30.9*  MCV 70.7* 69.2* 69.6* 71.5* 71.5*  PLT 214 226 248 328 302   Cardiac Enzymes:  Recent Labs Lab 09/06/14 1000  TROPONINI 0.13*   BNP: BNP (last 3 results)  Recent Labs  09/06/14 0500  BNP 312.7*    ProBNP (last 3 results) No results for input(s): PROBNP in the last 8760 hours.  CBG:  Recent Labs Lab 09/06/14 1619 09/07/14 0820 09/07/14 1211 09/07/14 1630 09/07/14 2313  GLUCAP 160* 102* 159* 120* 106*       Signed:  Dia Crawford, MD [facility] [phone] pager

## 2014-09-10 NOTE — Progress Notes (Signed)
Left the mother a message making her aware that her daughter's prescription was at the [facility] on [address]; the mother stated that she just picked the medicine up.

## 2014-09-10 NOTE — Progress Notes (Signed)
Dr. Sherral Hammers made aware of missed potassium dose.  States he will fax script to the pharmacy and patient's mother updated and verbalized understanding to pick up med from the pharmacy.

## 2017-04-12 IMAGING — CT CT HEAD W/O CM
1 of 3 series · 15 of 30 positions shown, 19 images · non-contrast
Comparison: None.

CLINICAL DATA: Altered mental status

EXAM:
CT HEAD WITHOUT CONTRAST
TECHNIQUE: Contiguous axial images were obtained from the base of the skull
through the vertex without intravenous contrast.

[Series 3: head 2.0 h70h · axial · 0.42mm/px · z∈[-133,+5]mm · 15 of 79 slices shown, 19 images]
[im 5/79  brain]
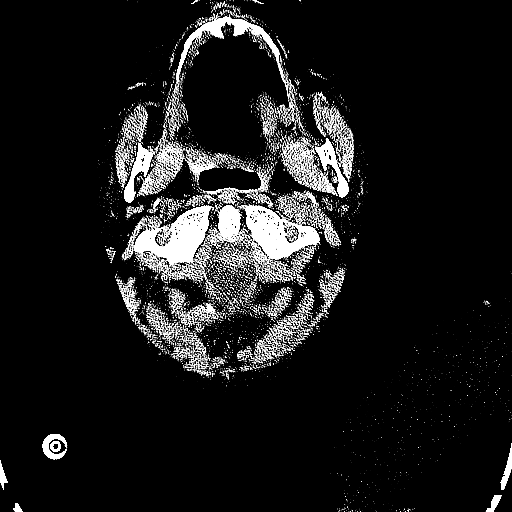
[im 5/79  bone]
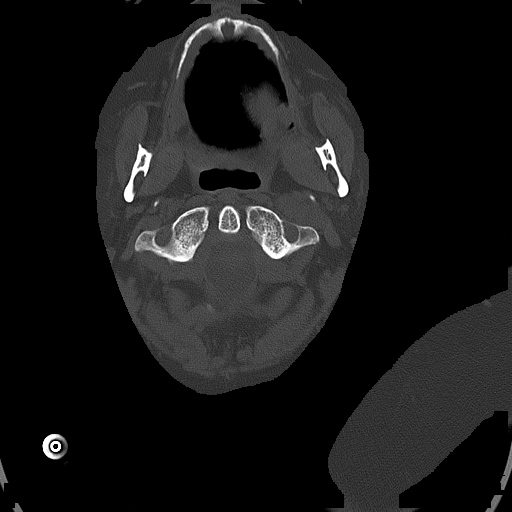
[im 10/79  brain]
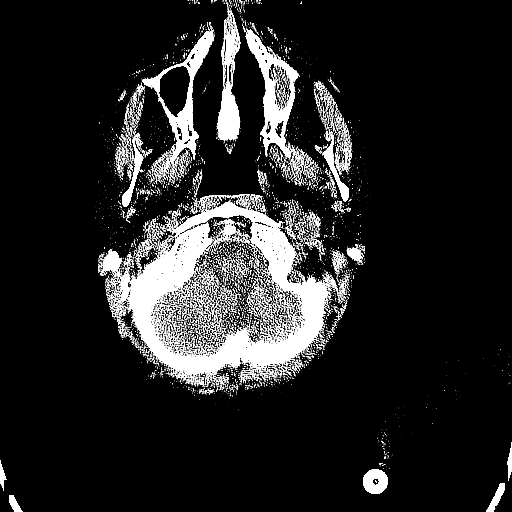
[im 15/79  brain]
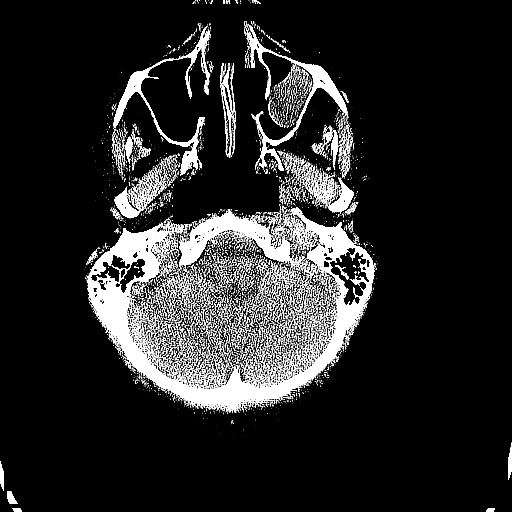
[im 20/79  brain]
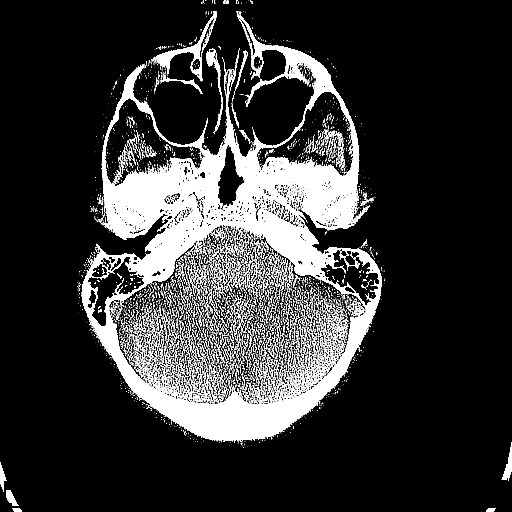
[im 25/79  brain]
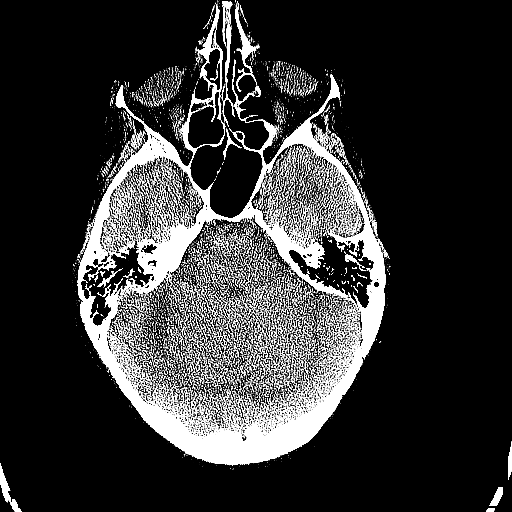
[im 25/79  bone]
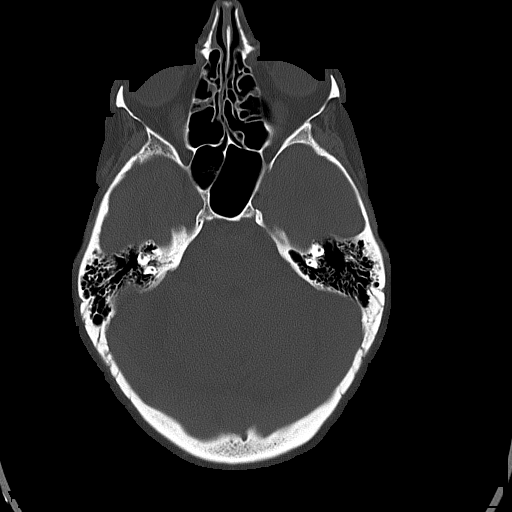
[im 30/79  brain]
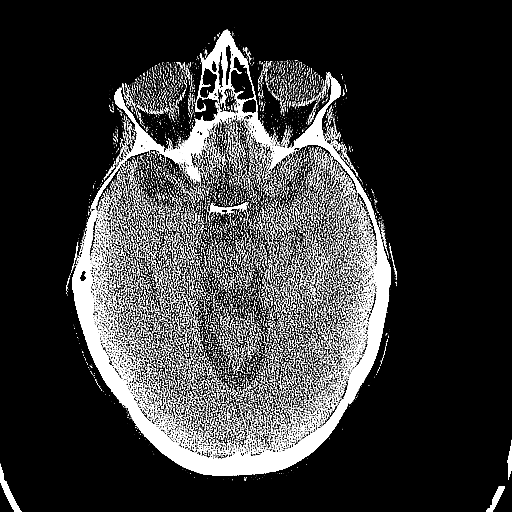
[im 35/79  brain]
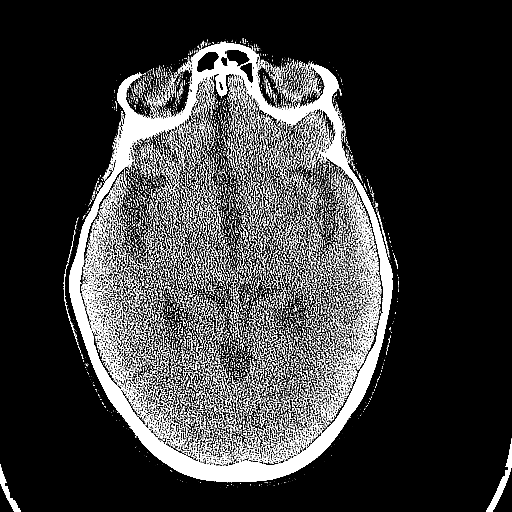
[im 40/79  brain]
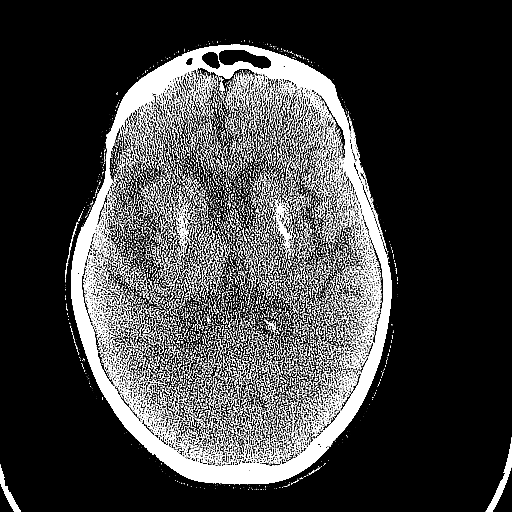
[im 44/79  brain]
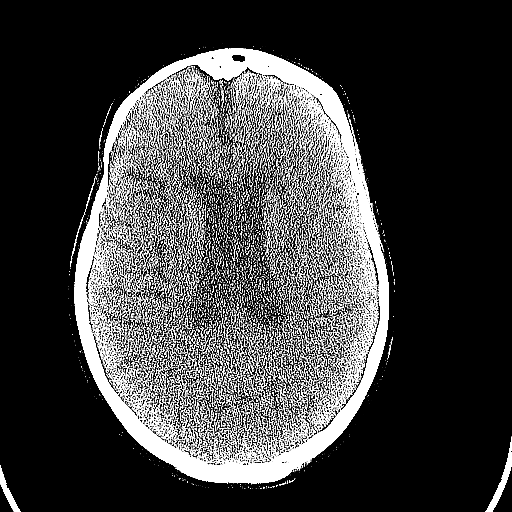
[im 44/79  bone]
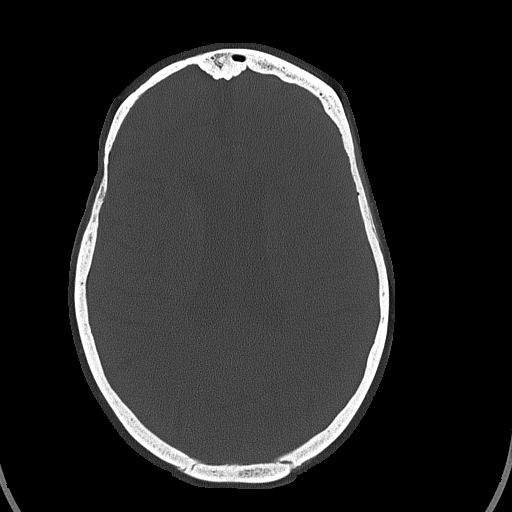
[im 49/79  brain]
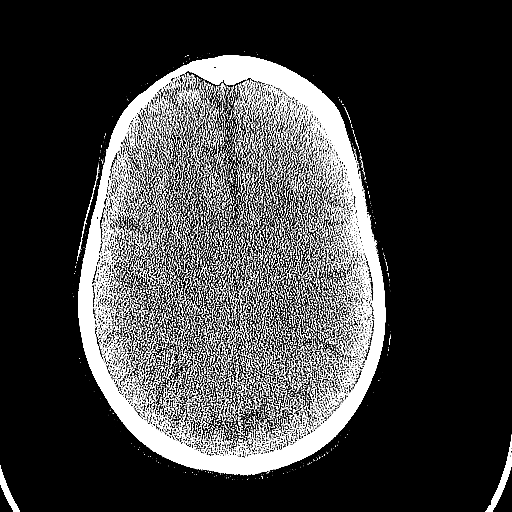
[im 54/79  brain]
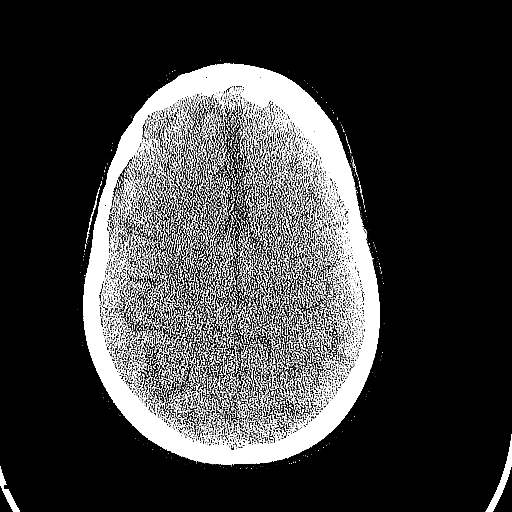
[im 59/79  brain]
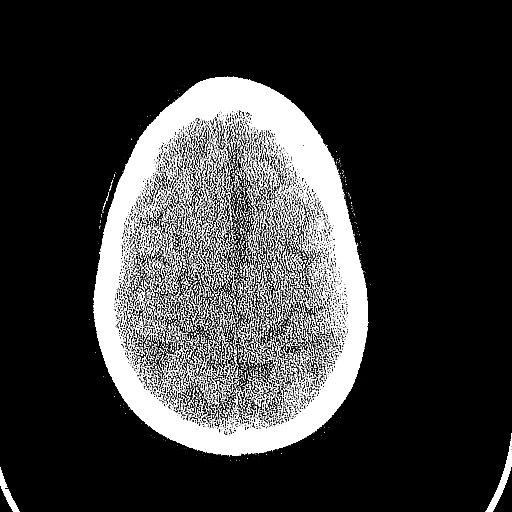
[im 64/79  brain]
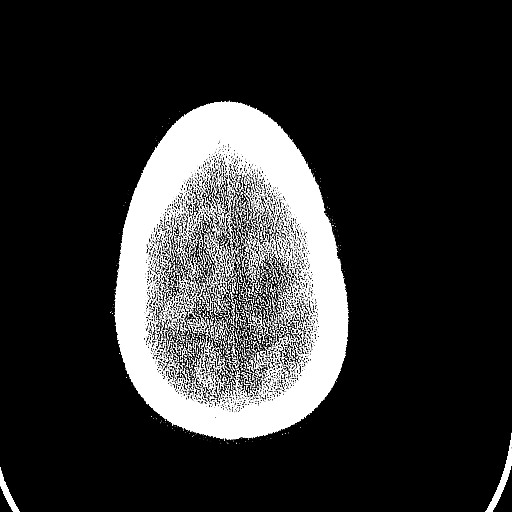
[im 64/79  bone]
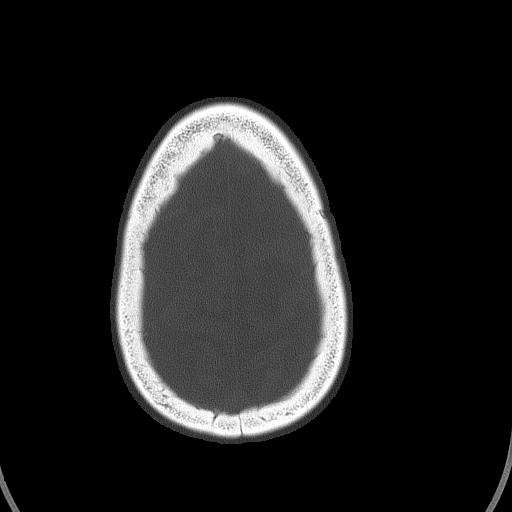
[im 69/79  brain]
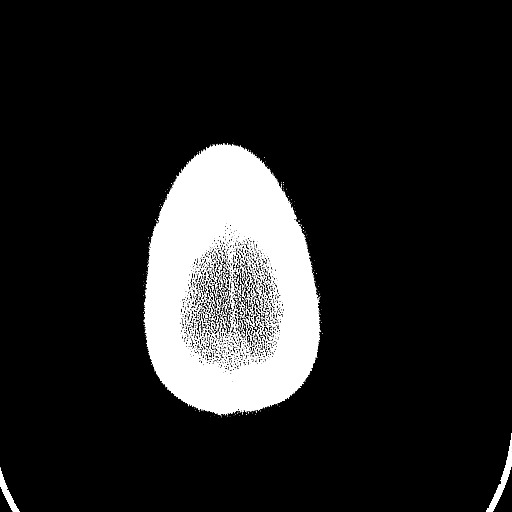
[im 74/79  brain]
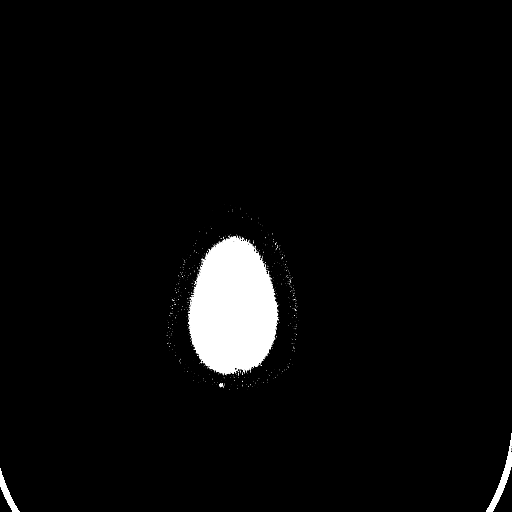

[15 of 30 positions shown; findings below may reference images not displayed]

FINDINGS: No skull fracture is noted. There is mucosal thickening with partial
opacification left maxillary sinus. The mastoid air cells are
unremarkable.

No intracranial hemorrhage, mass effect or midline shift. Moderate
cerebral atrophy. Mild periventricular white matter decreased
attenuation probable due to chronic small vessel ischemic changes.
Extensive bilateral basal ganglia calcifications are noted. No
definite acute cortical infarction. No mass lesion is noted on this
unenhanced scan. Small punctate calcification is noted in left
thalamus. There is small high density focus in left frontal lobe
measures 81 Hounsfield units in attenuation. Given extensive
calcification in basal ganglia this may represent a small
calcification however focal small hemorrhage may have the same
appearance. Clinical correlation is necessary.
IMPRESSION: There is no intraventricular hemorrhage. There is small high density
focus in left frontal lobe measures 81 Hounsfield units in
attenuation. Given extensive calcification in basal ganglia this may
represent a small calcification however focal small hemorrhage may
have the same appearance. Clinical correlation is necessary.
Extensive bilateral basal ganglia calcifications are noted. Mucosal
thickening with partial opacification left maxillary sinus. Moderate
cerebral atrophy. Periventricular white matter decreased attenuation
probable due to chronic small vessel ischemic changes. No definite
acute cortical infarction.

## 2017-04-16 IMAGING — CR DG CHEST 1V PORT
1 series · 1 of 1 positions shown · non-contrast
Comparison: 09/06/2014.

CLINICAL DATA: Respiratory failure.

EXAM:
PORTABLE CHEST - 1 VIEW

[AP]
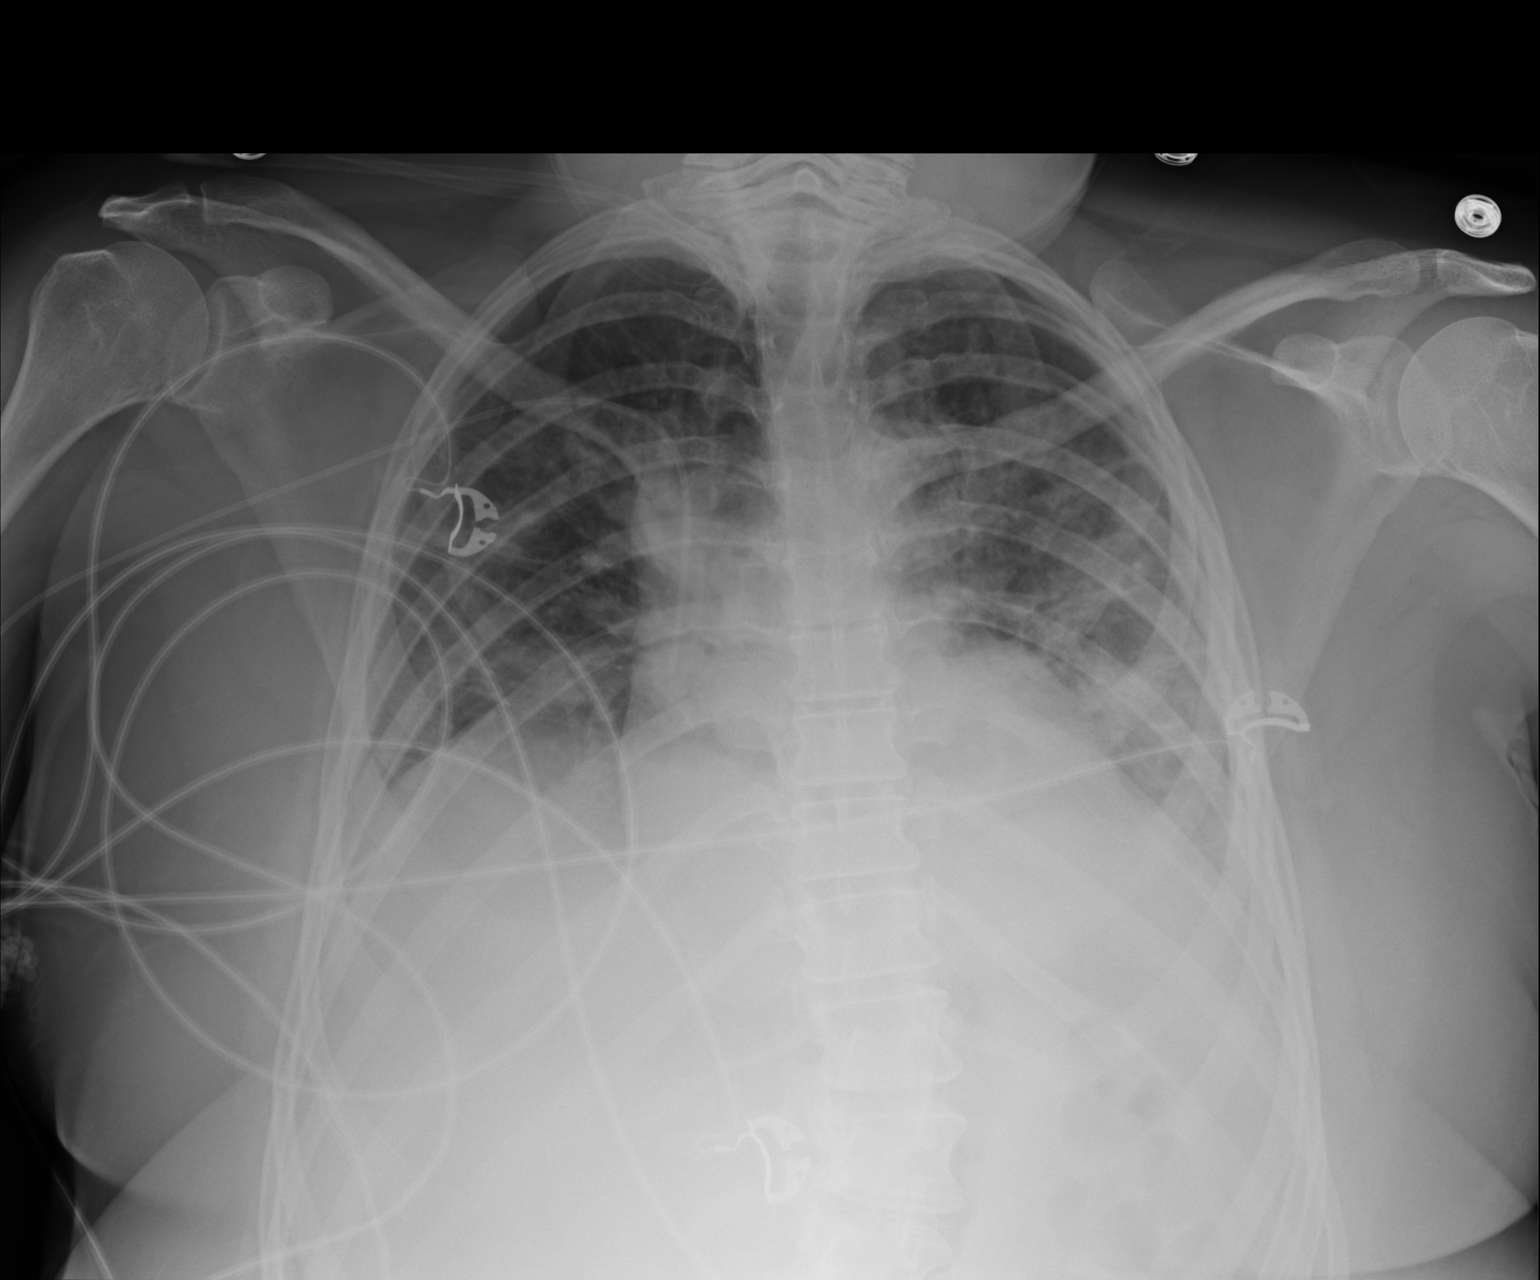

[1 of 1 positions shown; findings below may reference images not displayed]

FINDINGS: Right PICC line in stable position. Heart size stable. Low lung
volumes with bibasilar and bilateral mid lung field atelectasis
and/or infiltrates. Small left pleural effusion cannot be excluded.
No pneumothorax.
IMPRESSION: 1.  PICC line stable position.

2. Bilateral mid lung field and bibasilar subsegmental atelectasis
and/or infiltrates.

## 2017-04-17 IMAGING — CR DG CHEST 1V PORT
1 series · 1 of 1 positions shown · non-contrast
Comparison: 09/07/2014.

CLINICAL DATA: Shortness of breath.

EXAM:
PORTABLE CHEST - 1 VIEW

[AP]
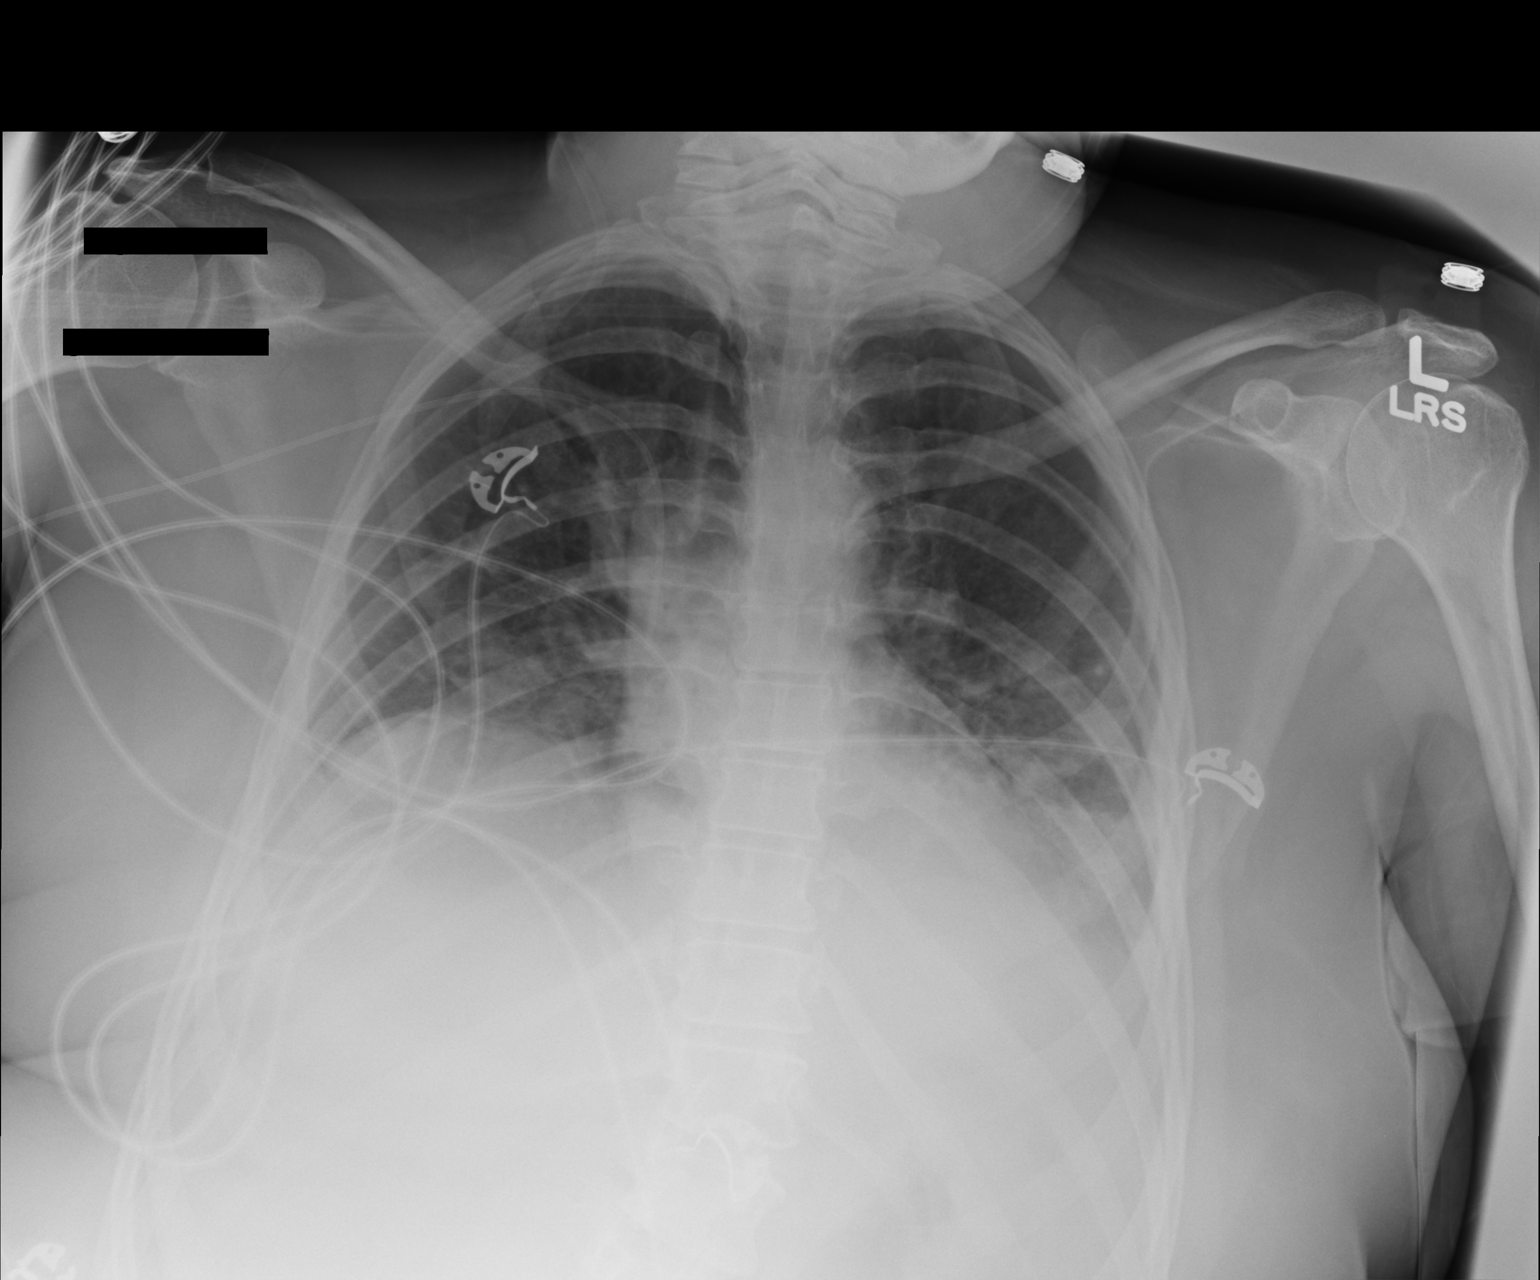

[1 of 1 positions shown; findings below may reference images not displayed]

FINDINGS: Right PICC line stable position. Mediastinum hilar structures are
unremarkable. Heart size normal. Interim improvement of bilateral
mid lung field and basilar atelectasis and/or infiltrates. Small
left pleural effusion. No pneumothorax
IMPRESSION: 1. Right PICC line in stable position.
2. Interim improvement of bilateral mid lung field and bibasilar
subsegmental atelectasis and/or infiltrates. Small left pleural
effusion.

## 2022-10-16 ENCOUNTER — Emergency Department (HOSPITAL_COMMUNITY)
Admission: EM | Admit: 2022-10-16 | Discharge: 2022-10-17 | Disposition: A | Discharge status: Home / Self Care | Payer: Medicare Other | Attending: Emergency Medicine | Admitting: Emergency Medicine

## 2022-10-16 ENCOUNTER — Encounter (HOSPITAL_COMMUNITY): Payer: Self-pay

## 2022-10-16 ENCOUNTER — Other Ambulatory Visit: Payer: Self-pay

## 2022-10-16 DIAGNOSIS — Z85841 Personal history of malignant neoplasm of brain: Secondary | ICD-10-CM | POA: Diagnosis not present

## 2022-10-16 DIAGNOSIS — I5032 Chronic diastolic (congestive) heart failure: Secondary | ICD-10-CM | POA: Diagnosis not present

## 2022-10-16 DIAGNOSIS — I959 Hypotension, unspecified: Secondary | ICD-10-CM | POA: Diagnosis not present

## 2022-10-16 DIAGNOSIS — Z7982 Long term (current) use of aspirin: Secondary | ICD-10-CM | POA: Diagnosis not present

## 2022-10-16 DIAGNOSIS — R55 Syncope and collapse: Secondary | ICD-10-CM

## 2022-10-16 LAB — COMPREHENSIVE METABOLIC PANEL
ALT: 19 U/L (ref 0–44)
AST: 23 U/L (ref 15–41)
Albumin: 4.2 g/dL (ref 3.5–5.0)
Alkaline Phosphatase: 89 U/L (ref 38–126)
Anion gap: 10 (ref 5–15)
BUN: 10 mg/dL (ref 6–20)
CO2: 23 mmol/L (ref 22–32)
Calcium: 9.2 mg/dL (ref 8.9–10.3)
Chloride: 97 mmol/L — ABNORMAL LOW (ref 98–111)
Creatinine, Ser: 0.88 mg/dL (ref 0.44–1.00)
GFR, Estimated: >60 mL/min (ref >60)
Glucose, Bld: 169 mg/dL — ABNORMAL HIGH (ref 70–99)
Potassium: 3.7 mmol/L (ref 3.5–5.1)
Sodium: 130 mmol/L — ABNORMAL LOW (ref 135–145)
Total Bilirubin: 0.2 mg/dL — ABNORMAL LOW (ref 0.3–1.2)
Total Protein: 7.2 g/dL (ref 6.5–8.1)

## 2022-10-16 NOTE — ED Triage Notes (Signed)
Pt BIB EMS hypotension, from home. Mom states that pt is more lethargic, nonambulatory per baseline. Pt states that this happens when her sodium levels are off. 600 ml ns iv 20 left hand.

## 2022-10-16 NOTE — ED Provider Notes (Signed)
[place] EMERGENCY DEPARTMENT AT [facility] Provider Note  CSN: [id number] Arrival date & time: 10/16/22 2218  Chief Complaint(s) Hypotension  HPI Beth Fitzgerald is a 47 y.o. female {Add pertinent medical, surgical, social history, OB history to HPI:1}   HPI  Past Medical History Past Medical History:  Diagnosis Date   Adrenal cortex insufficiency (HCC)    Brain cancer [facility])    Patient Active Problem List   Diagnosis Date Noted   Acute respiratory failure with hypoxia (HCC)    Acute renal failure syndrome (HCC)    Panhypopituitarism (HCC)    Acute encephalopathy    Blood poisoning    Chronic diastolic CHF (congestive heart failure) (HCC)    LVH (left ventricular hypertrophy)    Hypernatremia    Hypokalemia    Hyperchloremia    Metabolic acidosis    Altered mental status    Respiratory failure (HCC)    Sepsis (HCC) 09/02/2014   Severe sepsis (HCC) 09/02/2014   Home Medication(s) Prior to Admission medications   Medication Sig Start Date End Date Taking? Authorizing Provider  aspirin EC 81 MG tablet Take 81 mg by mouth daily.    [provider]  Calcium Carb-Cholecalciferol (CALCIUM 600+D) 600-800 MG-UNIT TABS Take 1 tablet by mouth daily.    [provider]  desmopressin (DDAVP NASAL) 0.01 % solution Place 1 spray (10 mcg total) into the nose 2 (two) times daily. 09/10/14   Drema Dallas, MD  hydrocortisone (CORTEF) 10 MG tablet Take 1 tablet (10 mg total) by mouth daily before breakfast. 09/10/14   Drema Dallas, MD  hydrocortisone (CORTEF) 5 MG tablet Take 1 tablet (5 mg total) by mouth daily with supper. 09/10/14   Drema Dallas, MD  levothyroxine (SYNTHROID, LEVOTHROID) 100 MCG tablet Take 100 mcg by mouth daily before breakfast.    [provider]  NEXIUM 40 MG capsule Take 40 mg by mouth daily. 06/10/14   [provider]  TRINESSA, 28, 0.18/0.215/0.25 MG-35 MCG tablet Take 1 tablet by mouth daily. 07/09/14    [provider]                                                                                                                                    Allergies Iodine and Sulfa antibiotics  Review of Systems Review of Systems As noted in HPI  Physical Exam Vital Signs  I have reviewed the triage vital signs BP 126/72 (BP Location: Right Arm)   Pulse 80   Temp (!) 97.3 F (36.3 C)   Ht 5' (1.524 m)   Wt 72.4 kg   SpO2 99%   BMI 31.17 kg/m  *** Physical Exam  ED Results and Treatments Labs (all labs ordered are listed, but only abnormal results are displayed) Labs Reviewed  COMPREHENSIVE METABOLIC PANEL - Abnormal; Notable for the following components:      Result Value   Sodium  130 (*)    Chloride 97 (*)    Glucose, Bld 169 (*)    Total Bilirubin 0.2 (*)    All other components within normal limits  CBC WITH DIFFERENTIAL/PLATELET                                                                                                                         EKG  EKG Interpretation Date/Time:    Ventricular Rate:    PR Interval:    QRS Duration:    QT Interval:    QTC Calculation:   R Axis:      Text Interpretation:         Radiology No results found.  Medications Ordered in ED Medications - No data to display Procedures Procedures  (including critical care time) Medical Decision Making / ED Course   Medical Decision Making Amount and/or Complexity of Data Reviewed Labs: ordered.    ***    Final Clinical Impression(s) / ED Diagnoses Final diagnoses:  None    This chart was dictated using voice recognition software.  Despite best efforts to proofread,  errors can occur which can change the documentation meaning.

## 2022-10-17 ENCOUNTER — Emergency Department (HOSPITAL_COMMUNITY): Payer: Medicare Other

## 2022-10-17 DIAGNOSIS — R55 Syncope and collapse: Secondary | ICD-10-CM | POA: Diagnosis not present

## 2022-10-17 LAB — CBC WITH DIFFERENTIAL/PLATELET
Abs Immature Granulocytes: 0.03 10*3/uL (ref 0.00–0.07)
Basophils Absolute: 0.1 10*3/uL (ref 0.0–0.1)
Basophils Relative: 1 %
Eosinophils Absolute: 0 10*3/uL (ref 0.0–0.5)
Eosinophils Relative: 0 %
HCT: 39.2 % (ref 36.0–46.0)
Hemoglobin: 13.8 g/dL (ref 12.0–15.0)
Immature Granulocytes: 0 %
Lymphocytes Relative: 11 %
Lymphs Abs: 1.1 10*3/uL (ref 0.7–4.0)
MCH: 29.4 pg (ref 26.0–34.0)
MCHC: 35.2 g/dL (ref 30.0–36.0)
MCV: 83.4 fL (ref 80.0–100.0)
Monocytes Absolute: 0.4 10*3/uL (ref 0.1–1.0)
Monocytes Relative: 4 %
Neutro Abs: 8.4 10*3/uL — ABNORMAL HIGH (ref 1.7–7.7)
Neutrophils Relative %: 84 %
Platelets: 434 10*3/uL — ABNORMAL HIGH (ref 150–400)
RBC: 4.7 MIL/uL (ref 3.87–5.11)
RDW: 12.7 % (ref 11.5–15.5)
WBC: 10 10*3/uL (ref 4.0–10.5)
nRBC: 0 % (ref 0.0–0.2)

## 2022-10-17 LAB — URINALYSIS, ROUTINE W REFLEX MICROSCOPIC
Bilirubin Urine: NEGATIVE
Glucose, UA: NEGATIVE mg/dL
Hgb urine dipstick: NEGATIVE
Ketones, ur: NEGATIVE mg/dL
Leukocytes,Ua: NEGATIVE
Nitrite: NEGATIVE
Protein, ur: 30 mg/dL — AB
Specific Gravity, Urine: 1.006 (ref 1.005–1.030)
pH: 8 (ref 5.0–8.0)

## 2022-10-17 LAB — BRAIN NATRIURETIC PEPTIDE: B Natriuretic Peptide: 41.2 pg/mL (ref 0.0–100.0)

## 2022-10-17 LAB — TROPONIN I (HIGH SENSITIVITY)
Troponin I (High Sensitivity): 47 ng/L — ABNORMAL HIGH (ref <18)
Troponin I (High Sensitivity): 24 ng/L — ABNORMAL HIGH (ref <18)
